# Patient Record
Sex: Female | Born: 1980 | Race: Black or African American | Hispanic: No | Smoking: Current some day smoker
Health system: Southern US, Community
[De-identification: ages and names within clinical notes are randomized; demographics above are authoritative.]

## PROBLEM LIST (undated history)

## (undated) DIAGNOSIS — F32A Depression, unspecified: Secondary | ICD-10-CM

## (undated) DIAGNOSIS — I10 Essential (primary) hypertension: Secondary | ICD-10-CM

## (undated) DIAGNOSIS — F329 Major depressive disorder, single episode, unspecified: Secondary | ICD-10-CM

## (undated) DIAGNOSIS — E282 Polycystic ovarian syndrome: Secondary | ICD-10-CM

## (undated) DIAGNOSIS — F419 Anxiety disorder, unspecified: Secondary | ICD-10-CM

## (undated) DIAGNOSIS — Z789 Other specified health status: Secondary | ICD-10-CM

## (undated) HISTORY — PX: HEMORROIDECTOMY: SUR656

---
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
## 2018-02-08 ENCOUNTER — Emergency Department (HOSPITAL_COMMUNITY)
Admission: EM | Admit: 2018-02-08 | Discharge: 2018-02-08 | Disposition: A | Discharge status: Home / Self Care | Payer: Self-pay | Attending: Emergency Medicine | Admitting: Emergency Medicine

## 2018-02-08 ENCOUNTER — Encounter (HOSPITAL_COMMUNITY): Payer: Self-pay | Admitting: Emergency Medicine

## 2018-02-08 DIAGNOSIS — I10 Essential (primary) hypertension: Secondary | ICD-10-CM | POA: Insufficient documentation

## 2018-02-08 DIAGNOSIS — F17200 Nicotine dependence, unspecified, uncomplicated: Secondary | ICD-10-CM | POA: Insufficient documentation

## 2018-02-08 DIAGNOSIS — H1033 Unspecified acute conjunctivitis, bilateral: Secondary | ICD-10-CM | POA: Insufficient documentation

## 2018-02-08 HISTORY — DX: Essential (primary) hypertension: I10

## 2018-02-08 MED ORDER — POLYMYXIN B-TRIMETHOPRIM 10000-0.1 UNIT/ML-% OP SOLN
1.0000 [drp] | OPHTHALMIC | Status: DC
  Administered 2018-02-08: 1 [drp] via OPHTHALMIC
  Filled 2018-02-08: qty 10

## 2018-02-08 MED ORDER — FLUORESCEIN SODIUM 1 MG OP STRP: 1.0000 | ORAL_STRIP | Freq: Once | OPHTHALMIC | Status: DC

## 2018-02-08 MED ORDER — TETRACAINE HCL 0.5 % OP SOLN: 2.0000 [drp] | Freq: Once | OPHTHALMIC | Status: DC

## 2018-02-08 NOTE — ED Provider Notes (Signed)
[facility] EMERGENCY DEPARTMENT Provider Note   CSN: [id number] Arrival date & time: 02/08/18  7829     History   Chief Complaint Chief Complaint  Patient presents with  . Conjunctivitis    HPI Krista Hamilton is a 37 y.o. female.  The history is provided by the patient and medical records.  Conjunctivitis      37 year old female presenting to the ED with bilateral eye redness.  States this began yesterday morning, initially in the right eye but has since spread to the left eye as well.  She denies any blurred vision.  She is not wearing glasses or contact lenses.  She denies any chemical or foreign body exposure to the eye.  No trauma.  States when she wakes in the morning her eyes are crusted shut but does have tearing during the day.  She has not tried any medications or eyedrops prior to arrival.  Past Medical History:  Diagnosis Date  . Hypertension     There are no active problems to display for this patient.   Past Surgical History:  Procedure Laterality Date  . HEMORROIDECTOMY       OB History   None      Home Medications    Prior to Admission medications   Not on File    Family History History reviewed. No pertinent family history.  Social History Social History   Tobacco Use  . Smoking status: Current Some Day Smoker  Substance Use Topics  . Alcohol use: Yes    Comment: occ  . Drug use: Not on file     Allergies   Patient has no known allergies.   Review of Systems Review of Systems  Eyes: Positive for redness.  All other systems reviewed and are negative.    Physical Exam Updated Vital Signs BP (!) 185/122 (BP Location: Right Arm)   Pulse 88   Temp 99 F (37.2 C) (Oral)   Resp 18   Ht 5\' 3"  (1.6 m)   Wt 98.4 kg   SpO2 100%   BMI 38.44 kg/m   Physical Exam  Constitutional: She is oriented to person, place, and time. She appears well-developed and well-nourished.  HENT:  Head: Normocephalic and  atraumatic.  Mouth/Throat: Oropharynx is clear and moist.  Eyes: Pupils are equal, round, and reactive to light. Conjunctivae and EOM are normal.  No significant lid edema or erythema, bilateral conjunctive are injected, right worse than left; no focal area of hemorrhage, EOMs are fully intact without nystagmus, symmetric and reactive bilaterally  Neck: Normal range of motion.  Cardiovascular: Normal rate, regular rhythm and normal heart sounds.  Pulmonary/Chest: Effort normal and breath sounds normal. No stridor. No respiratory distress.  Abdominal: Soft. Bowel sounds are normal. There is no tenderness. There is no rebound.  Musculoskeletal: Normal range of motion.  Neurological: She is alert and oriented to person, place, and time.  Skin: Skin is warm and dry.  Psychiatric: She has a normal mood and affect.  Nursing note and vitals reviewed.    ED Treatments / Results  Labs (all labs ordered are listed, but only abnormal results are displayed) Labs Reviewed - No data to display  EKG None  Radiology No results found.  Procedures Procedures (including critical care time)  Medications Ordered in ED Medications  tetracaine (PONTOCAINE) 0.5 % ophthalmic solution 2 drop (2 drops Both Eyes Not Given 02/08/18 0504)  fluorescein ophthalmic strip 1 strip (1 strip Both Eyes Not Given 02/08/18  1610)  trimethoprim-polymyxin b (POLYTRIM) ophthalmic solution 1 drop (has no administration in time range)     Initial Impression / Assessment and Plan / ED Course  I have reviewed the triage vital signs and the nursing notes.  Pertinent labs & imaging results that were available during my care of the patient were reviewed by me and considered in my medical decision making (see chart for details).  37 y.o. F here with bilateral eye redness.  She appears to have bilateral conjunctivitis, right eye is slightly worse than left.  She does not have any significant lid edema or erythema to suggest  orbital or preseptal cellulitis.  Her vision is normal.  Will start on Polytrim drops.  Discussed good hand hygiene and avoid touching her face.  Wash sheets, linens, towels after each use to prevent spread of infection.  Routine follow-up with PCP.  She will return here for any new/acute changes.  Final Clinical Impressions(s) / ED Diagnoses   Final diagnoses:  Acute bacterial conjunctivitis of both eyes    ED Discharge Orders    None       Garlon Hatchet, PA-C 02/08/18 0612    Mesner, Barbara Cower, MD 02/08/18 [phone]

## 2018-02-08 NOTE — ED Triage Notes (Signed)
Pt presents with week long eye redness initially to R eye and now bilat eyes; with perioribital swelling and tearing; pt denies itching/burning

## 2018-02-08 NOTE — Discharge Instructions (Signed)
Continue using eye drops as directed. Keep hands washed, try to avoid touching your face/eyes Wash all sheets, linens, towels, etc after each use to prevent recurrent infection. Return here for any new/acute changes.

## 2018-02-08 NOTE — ED Notes (Signed)
Patient verbalizes understanding of discharge instructions. Opportunity for questioning and answers were provided. Armband removed by staff, pt discharged from ED ambulatory by self\  

## 2018-06-19 ENCOUNTER — Other Ambulatory Visit: Payer: Self-pay

## 2018-06-19 ENCOUNTER — Emergency Department (HOSPITAL_COMMUNITY)
Admission: EM | Admit: 2018-06-19 | Discharge: 2018-06-20 | Disposition: A | Discharge status: Home / Self Care | Payer: Medicaid Other | Attending: Emergency Medicine | Admitting: Emergency Medicine

## 2018-06-19 ENCOUNTER — Encounter (HOSPITAL_COMMUNITY): Payer: Self-pay | Admitting: Emergency Medicine

## 2018-06-19 DIAGNOSIS — F17290 Nicotine dependence, other tobacco product, uncomplicated: Secondary | ICD-10-CM | POA: Diagnosis not present

## 2018-06-19 DIAGNOSIS — I10 Essential (primary) hypertension: Secondary | ICD-10-CM | POA: Diagnosis not present

## 2018-06-19 DIAGNOSIS — R1030 Lower abdominal pain, unspecified: Secondary | ICD-10-CM

## 2018-06-19 DIAGNOSIS — N739 Female pelvic inflammatory disease, unspecified: Secondary | ICD-10-CM | POA: Insufficient documentation

## 2018-06-19 DIAGNOSIS — A5901 Trichomonal vulvovaginitis: Secondary | ICD-10-CM | POA: Diagnosis not present

## 2018-06-19 DIAGNOSIS — N730 Acute parametritis and pelvic cellulitis: Secondary | ICD-10-CM

## 2018-06-19 DIAGNOSIS — A599 Trichomoniasis, unspecified: Secondary | ICD-10-CM

## 2018-06-19 HISTORY — DX: Polycystic ovarian syndrome: E28.2

## 2018-06-19 LAB — CBC
HEMATOCRIT: 39.6 % (ref 36.0–46.0)
Hemoglobin: 11.9 g/dL — ABNORMAL LOW (ref 12.0–15.0)
MCH: 21.3 pg — AB (ref 26.0–34.0)
MCHC: 30.1 g/dL (ref 30.0–36.0)
MCV: 71 fL — AB (ref 80.0–100.0)
PLATELETS: 250 10*3/uL (ref 150–400)
RBC: 5.58 MIL/uL — AB (ref 3.87–5.11)
RDW: 14.6 % (ref 11.5–15.5)
WBC: 7.6 10*3/uL (ref 4.0–10.5)
nRBC: 0 % (ref 0.0–0.2)

## 2018-06-19 LAB — COMPREHENSIVE METABOLIC PANEL
ALBUMIN: 4 g/dL (ref 3.5–5.0)
ALK PHOS: 64 U/L (ref 38–126)
ALT: 14 U/L (ref 0–44)
AST: 19 U/L (ref 15–41)
Anion gap: 9 (ref 5–15)
BUN: 12 mg/dL (ref 6–20)
CALCIUM: 9.1 mg/dL (ref 8.9–10.3)
CHLORIDE: 104 mmol/L (ref 98–111)
CO2: 24 mmol/L (ref 22–32)
CREATININE: 1.17 mg/dL — AB (ref 0.44–1.00)
GFR calc Af Amer: >60 mL/min (ref >60)
GFR calc non Af Amer: >60 mL/min (ref >60)
GLUCOSE: 84 mg/dL (ref 70–99)
Potassium: 3.9 mmol/L (ref 3.5–5.1)
SODIUM: 137 mmol/L (ref 135–145)
Total Bilirubin: 0.8 mg/dL (ref 0.3–1.2)
Total Protein: 7 g/dL (ref 6.5–8.1)

## 2018-06-19 LAB — URINALYSIS, ROUTINE W REFLEX MICROSCOPIC
Bilirubin Urine: NEGATIVE
GLUCOSE, UA: NEGATIVE mg/dL
HGB URINE DIPSTICK: NEGATIVE
Ketones, ur: 5 mg/dL — AB
Leukocytes,Ua: NEGATIVE
Nitrite: NEGATIVE
PH: 5 (ref 5.0–8.0)
PROTEIN: NEGATIVE mg/dL
Specific Gravity, Urine: 1.025 (ref 1.005–1.030)

## 2018-06-19 LAB — I-STAT BETA HCG BLOOD, ED (MC, WL, AP ONLY): I-stat hCG, quantitative: <5 m[IU]/mL (ref <5)

## 2018-06-19 LAB — LIPASE, BLOOD: LIPASE: 29 U/L (ref 11–51)

## 2018-06-19 MED ORDER — SODIUM CHLORIDE 0.9% FLUSH
3.0000 mL | Freq: Once | INTRAVENOUS | Status: AC
  Administered 2018-06-20: 3 mL via INTRAVENOUS

## 2018-06-19 NOTE — ED Triage Notes (Addendum)
C/o mid lower abd pain x 2 weeks and constipation x 1 day.  Denies nausea and vomiting. Denies urinary complaints.

## 2018-06-19 NOTE — ED Notes (Signed)
Pt remains in waiting room. Updated on wait for treatment room. 

## 2018-06-20 ENCOUNTER — Emergency Department (HOSPITAL_COMMUNITY): Payer: Medicaid Other

## 2018-06-20 LAB — WET PREP, GENITAL
SPERM: NONE SEEN
YEAST WET PREP: NONE SEEN

## 2018-06-20 MED ORDER — IOHEXOL 300 MG/ML  SOLN
100.0000 mL | Freq: Once | INTRAMUSCULAR | Status: AC | PRN
  Administered 2018-06-20: 100 mL via INTRAVENOUS

## 2018-06-20 MED ORDER — AZITHROMYCIN 250 MG PO TABS
1000.0000 mg | ORAL_TABLET | Freq: Once | ORAL | Status: AC
  Administered 2018-06-20: 1000 mg via ORAL
  Filled 2018-06-20: qty 4

## 2018-06-20 MED ORDER — CEFTRIAXONE SODIUM 250 MG IJ SOLR
250.0000 mg | Freq: Once | INTRAMUSCULAR | Status: AC
  Administered 2018-06-20: 250 mg via INTRAMUSCULAR
  Filled 2018-06-20: qty 250

## 2018-06-20 MED ORDER — LIDOCAINE HCL (PF) 1 % IJ SOLN
INTRAMUSCULAR | Status: AC
  Administered 2018-06-20: 0.9 mL
  Filled 2018-06-20: qty 5

## 2018-06-20 NOTE — ED Notes (Signed)
Patient transported to CT 

## 2018-06-20 NOTE — ED Notes (Signed)
Called by Lab. Wet prep arrived open, rendering all samples useless. MD aware.

## 2018-06-20 NOTE — ED Provider Notes (Signed)
[facility] EMERGENCY DEPARTMENT Provider Note   CSN: [id number] Arrival date & time: 06/19/18  1857    History   Chief Complaint Chief Complaint  Patient presents with  . Abdominal Pain    HPI Krista Hamilton is a 38 y.o. female.     HPI   Reports abdominal pain Constipation, pressure today 2 weeks ago, not sure if worse because it has been the same for 2 weeks or worse because of the constipation Lower abdomen, shoots the right side Pressure like pain Sometimes worse when laying on abdomen Not worse after eating, not worse with movment Rubbing the area helps some No nausea, no vomiting, no fevers No colicky pain Constant pressure 8/10 pain now No dysuria, no vaginal discharge or bleeding Sexually active  Not on blood pressure medicine   Past Medical History:  Diagnosis Date  . Hypertension   . PCOS (polycystic ovarian syndrome)     There are no active problems to display for this patient.   Past Surgical History:  Procedure Laterality Date  . HEMORROIDECTOMY       OB History   No obstetric history on file.      Home Medications    Prior to Admission medications   Not on File    Family History No family history on file.  Social History Social History   Tobacco Use  . Smoking status: Current Some Day Smoker    Types: Cigars  . Smokeless tobacco: Never Used  Substance Use Topics  . Alcohol use: Yes    Comment: occ  . Drug use: Not Currently     Allergies   Patient has no known allergies.   Review of Systems Review of Systems  Constitutional: Negative for fever.  HENT: Negative for sore throat.   Eyes: Negative for visual disturbance.  Respiratory: Negative for cough and shortness of breath.   Cardiovascular: Negative for chest pain.  Gastrointestinal: Positive for abdominal pain and constipation. Negative for diarrhea, nausea and vomiting.  Genitourinary: Negative for difficulty urinating and dysuria.    Musculoskeletal: Negative for back pain and neck pain.  Skin: Negative for rash.  Neurological: Negative for syncope and headaches.     Physical Exam Updated Vital Signs BP (!) 155/107   Pulse 72   Temp 99 F (37.2 C) (Oral)   Resp 16   LMP 05/15/2018   SpO2 98%   Physical Exam Vitals signs and nursing note reviewed.  Constitutional:      General: She is not in acute distress.    Appearance: She is well-developed. She is not diaphoretic.  HENT:     Head: Normocephalic and atraumatic.  Eyes:     Conjunctiva/sclera: Conjunctivae normal.  Neck:     Musculoskeletal: Normal range of motion.  Cardiovascular:     Rate and Rhythm: Normal rate and regular rhythm.     Heart sounds: Normal heart sounds. No murmur. No friction rub. No gallop.   Pulmonary:     Effort: Pulmonary effort is normal. No respiratory distress.     Breath sounds: Normal breath sounds. No wheezing or rales.  Abdominal:     General: There is no distension.     Palpations: Abdomen is soft.     Tenderness: There is abdominal tenderness in the right lower quadrant. There is no guarding. Positive signs include Murphy's sign.  Genitourinary:    Cervix: Discharge present. No cervical motion tenderness.     Uterus: Not tender (0-mild).  Adnexa:        Right: Tenderness present.        Left: No tenderness.    Musculoskeletal:        General: No tenderness.  Skin:    General: Skin is warm and dry.     Findings: No erythema or rash.  Neurological:     Mental Status: She is alert and oriented to person, place, and time.      ED Treatments / Results  Labs (all labs ordered are listed, but only abnormal results are displayed) Labs Reviewed  WET PREP, GENITAL - Abnormal; Notable for the following components:      Result Value   Trich, Wet Prep PRESENT (*)    Clue Cells Wet Prep HPF POC PRESENT (*)    WBC, Wet Prep HPF POC MANY (*)    All other components within normal limits  COMPREHENSIVE METABOLIC  PANEL - Abnormal; Notable for the following components:   Creatinine, Ser 1.17 (*)    All other components within normal limits  CBC - Abnormal; Notable for the following components:   RBC 5.58 (*)    Hemoglobin 11.9 (*)    MCV 71.0 (*)    MCH 21.3 (*)    All other components within normal limits  URINALYSIS, ROUTINE W REFLEX MICROSCOPIC - Abnormal; Notable for the following components:   APPearance HAZY (*)    Ketones, ur 5 (*)    All other components within normal limits  LIPASE, BLOOD  I-STAT BETA HCG BLOOD, ED ([facility], [facility], [facility] ONLY)  GC/CHLAMYDIA PROBE AMP ([place]) NOT AT [facility]    EKG None  Radiology Ct Abdomen Pelvis W Contrast  Result Date: 06/20/2018 CLINICAL DATA:  Abdominal pain for 2 days. Constipation for 1 day. History of polycystic ovarian syndrome. EXAM: CT ABDOMEN AND PELVIS WITH CONTRAST TECHNIQUE: Multidetector CT imaging of the abdomen and pelvis was performed using the standard protocol following bolus administration of intravenous contrast. CONTRAST:  OMNIPAQUE IOHEXOL 300 MG/ML  SOLN COMPARISON:  None. FINDINGS: LOWER CHEST: Lung bases are clear. Included heart size is normal. No pericardial effusion. HEPATOBILIARY: 2 subcentimeter hypodensities in the dome of the liver could reflect cysts. Liver and gallbladder are otherwise unremarkable. Mild focal fatty infiltration about the falciform ligament. PANCREAS: Normal. SPLEEN: Normal. ADRENALS/URINARY TRACT: Kidneys are orthotopic, demonstrating symmetric enhancement. No nephrolithiasis, hydronephrosis or solid renal masses. Too small to characterize hypodensities bilateral kidneys. The unopacified ureters are normal in course and caliber. Urinary bladder is partially distended and unremarkable. Normal adrenal glands. STOMACH/BOWEL: The stomach, small and large bowel are normal in course and caliber without inflammatory changes. Normal appendix. VASCULAR/LYMPHATIC: Aortoiliac vessels are normal in course and caliber.  Mild calcific atherosclerosis. No lymphadenopathy by CT size criteria. REPRODUCTIVE: Normal.  16 mm RIGHT Bartholin cyst. OTHER: Trace free fluid in the pelvis is likely physiologic. No intraperitoneal free air or focal fluid collections. MUSCULOSKELETAL: Nonacute.  Mild rectus abdominus diastasis. IMPRESSION: 1. No acute intra-abdominal/pelvic process.  Normal appendix. 2.  Aortic Atherosclerosis (ICD10-I70.0). Electronically Signed   By: Awilda Metro M.D.   On: 06/20/2018 04:47    Procedures Procedures (including critical care time)  Medications Ordered in ED Medications  sodium chloride flush (NS) 0.9 % injection 3 mL (3 mLs Intravenous Given 06/20/18 0308)  cefTRIAXone (ROCEPHIN) injection 250 mg (250 mg Intramuscular Given 06/20/18 0321)  azithromycin (ZITHROMAX) tablet 1,000 mg (1,000 mg Oral Given 06/20/18 0316)  lidocaine (PF) (XYLOCAINE) 1 % injection (0.9 mLs  Given  06/20/18 0321)  iohexol (OMNIPAQUE) 300 MG/ML solution 100 mL (100 mLs Intravenous Contrast Given 06/20/18 0357)     Initial Impression / Assessment and Plan / ED Course  I have reviewed the triage vital signs and the nursing notes.  Pertinent labs & imaging results that were available during my care of the patient were reviewed by me and considered in my medical decision making (see chart for details).       38yo female presents with lower abdominal pain. Pregnancy test negative. Hx not consistent  With nephrolithiasis or torsion.  CT shows no appendicitis. Wet prep with trichomonas. Will treat for PID. Also noted htn, will rx amlodipine.  Rx called into [facility].    Final Clinical Impressions(s) / ED Diagnoses   Final diagnoses:  PID (acute pelvic inflammatory disease)  Lower abdominal pain  Essential hypertension  Trichomonas infection    ED Discharge Orders    None       Alvira Monday, MD 06/21/18 [phone]

## 2018-06-20 NOTE — ED Notes (Signed)
Reviewed d/c instructions with pt, who verbalized understanding and had no outstanding questions. Armband and pt labels removed and placed in shred bin. Pt departed in NAD, refused use of wheelchair.   

## 2018-06-20 NOTE — ED Provider Notes (Signed)
Approached by nursing staff regarding the patient's pelvic exam.  Pelvic exam had previously been completed by Dr. Dalene Seltzer.  Nursing staff reports the cap for the wet prep tube came off in transit and needed to be recollected.  Dr. Dalene Seltzer was attending to an acutely ill patient and was not available at that time for repeat pelvic exam.  Nursing staff discussed self swab as an option for recollection, but the patient did not feel comfortable that she would collect an adequate sample.  Nursing staff reports that the patient felt comfortable with another provider repeating the pelvic exam to expedite her ER visit.  After introducing myself to the patient, I requested permission to repeat the pelvic exam.  Consent was obtained at that time.  On exam, there was a large amount of malodorous whitish-yellow discharge.  No other obvious abnormalities.  Bimanual exam was not repeated.  Dr. Dalene Seltzer to follow-up on wet prep results.   Frederik Pear A, PA-C 06/20/18 5465    Alvira Monday, MD 06/20/18 2320

## 2018-06-20 NOTE — ED Notes (Signed)
Pelvic cart to bedside 

## 2018-06-20 NOTE — ED Notes (Signed)
ED Provider at bedside. 

## 2018-06-22 LAB — GC/CHLAMYDIA PROBE AMP (~~LOC~~) NOT AT ARMC
CHLAMYDIA, DNA PROBE: NEGATIVE
NEISSERIA GONORRHEA: NEGATIVE

## 2019-05-07 ENCOUNTER — Inpatient Hospital Stay (HOSPITAL_COMMUNITY)
Admission: EM | Admit: 2019-05-07 | Discharge: 2019-05-09 | DRG: 918 | Disposition: A | Discharge status: Other Acute Inpatient Hospital | Payer: Medicaid Other | Attending: Internal Medicine | Admitting: Internal Medicine

## 2019-05-07 ENCOUNTER — Other Ambulatory Visit: Payer: Self-pay

## 2019-05-07 ENCOUNTER — Encounter (HOSPITAL_COMMUNITY): Payer: Self-pay

## 2019-05-07 DIAGNOSIS — F32A Depression, unspecified: Secondary | ICD-10-CM

## 2019-05-07 DIAGNOSIS — Y92013 Bedroom of single-family (private) house as the place of occurrence of the external cause: Secondary | ICD-10-CM

## 2019-05-07 DIAGNOSIS — F17290 Nicotine dependence, other tobacco product, uncomplicated: Secondary | ICD-10-CM | POA: Diagnosis present

## 2019-05-07 DIAGNOSIS — Z7289 Other problems related to lifestyle: Secondary | ICD-10-CM

## 2019-05-07 DIAGNOSIS — Z800 Family history of malignant neoplasm of digestive organs: Secondary | ICD-10-CM

## 2019-05-07 DIAGNOSIS — T50904A Poisoning by unspecified drugs, medicaments and biological substances, undetermined, initial encounter: Secondary | ICD-10-CM

## 2019-05-07 DIAGNOSIS — Z79899 Other long term (current) drug therapy: Secondary | ICD-10-CM

## 2019-05-07 DIAGNOSIS — T364X1A Poisoning by tetracyclines, accidental (unintentional), initial encounter: Secondary | ICD-10-CM | POA: Diagnosis present

## 2019-05-07 DIAGNOSIS — I10 Essential (primary) hypertension: Secondary | ICD-10-CM | POA: Diagnosis present

## 2019-05-07 DIAGNOSIS — F419 Anxiety disorder, unspecified: Secondary | ICD-10-CM | POA: Diagnosis present

## 2019-05-07 DIAGNOSIS — F329 Major depressive disorder, single episode, unspecified: Secondary | ICD-10-CM | POA: Diagnosis present

## 2019-05-07 DIAGNOSIS — Z20822 Contact with and (suspected) exposure to covid-19: Secondary | ICD-10-CM | POA: Diagnosis present

## 2019-05-07 DIAGNOSIS — T461X1A Poisoning by calcium-channel blockers, accidental (unintentional), initial encounter: Principal | ICD-10-CM | POA: Diagnosis present

## 2019-05-07 DIAGNOSIS — T50901A Poisoning by unspecified drugs, medicaments and biological substances, accidental (unintentional), initial encounter: Secondary | ICD-10-CM | POA: Diagnosis present

## 2019-05-07 DIAGNOSIS — G4700 Insomnia, unspecified: Secondary | ICD-10-CM | POA: Diagnosis present

## 2019-05-07 HISTORY — DX: Depression, unspecified: F32.A

## 2019-05-07 LAB — CBC WITH DIFFERENTIAL/PLATELET
Abs Immature Granulocytes: 0.01 10*3/uL (ref 0.00–0.07)
Basophils Absolute: 0.1 10*3/uL (ref 0.0–0.1)
Basophils Relative: 1 %
Eosinophils Absolute: 0.1 10*3/uL (ref 0.0–0.5)
Eosinophils Relative: 1 %
HCT: 46.4 % — ABNORMAL HIGH (ref 36.0–46.0)
Hemoglobin: 14 g/dL (ref 12.0–15.0)
Immature Granulocytes: 0 %
Lymphocytes Relative: 62 %
Lymphs Abs: 4.4 10*3/uL — ABNORMAL HIGH (ref 0.7–4.0)
MCH: 21.6 pg — ABNORMAL LOW (ref 26.0–34.0)
MCHC: 30.2 g/dL (ref 30.0–36.0)
MCV: 71.5 fL — ABNORMAL LOW (ref 80.0–100.0)
Monocytes Absolute: 0.6 10*3/uL (ref 0.1–1.0)
Monocytes Relative: 8 %
Neutro Abs: 2 10*3/uL (ref 1.7–7.7)
Neutrophils Relative %: 28 %
Platelets: 268 10*3/uL (ref 150–400)
RBC: 6.49 MIL/uL — ABNORMAL HIGH (ref 3.87–5.11)
RDW: 14.9 % (ref 11.5–15.5)
WBC: 7.2 10*3/uL (ref 4.0–10.5)
nRBC: 0 % (ref 0.0–0.2)

## 2019-05-07 LAB — ACETAMINOPHEN LEVEL: Acetaminophen (Tylenol), Serum: <10 ug/mL — ABNORMAL LOW (ref 10–30)

## 2019-05-07 LAB — COMPREHENSIVE METABOLIC PANEL
ALT: 14 U/L (ref 0–44)
AST: 22 U/L (ref 15–41)
Albumin: 4.3 g/dL (ref 3.5–5.0)
Alkaline Phosphatase: 71 U/L (ref 38–126)
Anion gap: 8 (ref 5–15)
BUN: 7 mg/dL (ref 6–20)
CO2: 24 mmol/L (ref 22–32)
Calcium: 9.2 mg/dL (ref 8.9–10.3)
Chloride: 105 mmol/L (ref 98–111)
Creatinine, Ser: 1.35 mg/dL — ABNORMAL HIGH (ref 0.44–1.00)
GFR calc Af Amer: 58 mL/min — ABNORMAL LOW (ref >60)
GFR calc non Af Amer: 50 mL/min — ABNORMAL LOW (ref >60)
Glucose, Bld: 91 mg/dL (ref 70–99)
Potassium: 4.1 mmol/L (ref 3.5–5.1)
Sodium: 137 mmol/L (ref 135–145)
Total Bilirubin: 0.7 mg/dL (ref 0.3–1.2)
Total Protein: 7.2 g/dL (ref 6.5–8.1)

## 2019-05-07 LAB — URINALYSIS, ROUTINE W REFLEX MICROSCOPIC
Bilirubin Urine: NEGATIVE
Glucose, UA: NEGATIVE mg/dL
Hgb urine dipstick: NEGATIVE
Ketones, ur: NEGATIVE mg/dL
Leukocytes,Ua: NEGATIVE
Nitrite: NEGATIVE
Protein, ur: NEGATIVE mg/dL
Specific Gravity, Urine: 1.009 (ref 1.005–1.030)
pH: 8 (ref 5.0–8.0)

## 2019-05-07 LAB — RAPID URINE DRUG SCREEN, HOSP PERFORMED
Amphetamines: NOT DETECTED
Barbiturates: NOT DETECTED
Benzodiazepines: NOT DETECTED
Cocaine: NOT DETECTED
Opiates: NOT DETECTED
Tetrahydrocannabinol: NOT DETECTED

## 2019-05-07 LAB — ETHANOL: Alcohol, Ethyl (B): <10 mg/dL (ref <10)

## 2019-05-07 LAB — CBG MONITORING, ED: Glucose-Capillary: 70 mg/dL (ref 70–99)

## 2019-05-07 LAB — I-STAT BETA HCG BLOOD, ED (MC, WL, AP ONLY): I-stat hCG, quantitative: <5 m[IU]/mL (ref <5)

## 2019-05-07 LAB — SALICYLATE LEVEL: Salicylate Lvl: <7 mg/dL — ABNORMAL LOW (ref 7.0–30.0)

## 2019-05-07 MED ORDER — SODIUM CHLORIDE 0.9 % IV BOLUS
1000.0000 mL | Freq: Once | INTRAVENOUS | Status: AC
  Administered 2019-05-07: 1000 mL via INTRAVENOUS

## 2019-05-07 MED ORDER — ACETAMINOPHEN 650 MG RE SUPP: 650.0000 mg | Freq: Four times a day (QID) | RECTAL | Status: DC | PRN

## 2019-05-07 MED ORDER — SODIUM CHLORIDE 0.9 % IV SOLN: INTRAVENOUS | Status: AC

## 2019-05-07 MED ORDER — ENOXAPARIN SODIUM 40 MG/0.4ML ~~LOC~~ SOLN
40.0000 mg | SUBCUTANEOUS | Status: DC
  Administered 2019-05-08: 40 mg via SUBCUTANEOUS
  Filled 2019-05-07 (×2): qty 0.4

## 2019-05-07 MED ORDER — SODIUM CHLORIDE 0.9 % IV SOLN: INTRAVENOUS | Status: DC

## 2019-05-07 MED ORDER — ACETAMINOPHEN 325 MG PO TABS
650.0000 mg | ORAL_TABLET | Freq: Four times a day (QID) | ORAL | Status: DC | PRN
  Administered 2019-05-08 – 2019-05-09 (×2): 650 mg via ORAL
  Filled 2019-05-07 (×2): qty 2

## 2019-05-07 NOTE — ED Provider Notes (Signed)
[facility] EMERGENCY DEPARTMENT Provider Note   CSN: [id number] Arrival date & time: 05/07/19  2138     History Chief Complaint  Patient presents with  . Drug Overdose    Krista Hamilton is a 39 y.o. female.  HPI 39 year old female with history of hypertension, presenting to the emergency department for witnessed self-injurious behavior.  Per report from EMS, patient got in a dispute with her significant other, went to another room where her child witnessed her take an unknown amount of amlodipine, as well as Excedrin and doxycycline.  Unknown amount of ingestion, approximately 2 hours prior to presentation which was approximately 8 PM.  EMS called poison control as well.  Per report this was a suicide attempt.  Patient has no documented history of suicide attempt in the past or psychiatric disease.  Pill bottles were obtained by EMS, patient was on 5 mg amlodipine daily, however the prescription was approximately 1 year ago.  Unsure amount, there were 30 pills on the initial prescription.  Unknown amount of Excedrin and doxycycline as well.  On arrival to the ED, patient was arousable to painful stimuli, hypertensive and with a normal heart rate.    Past Medical History:  Diagnosis Date  . Depression   . Hypertension   . PCOS (polycystic ovarian syndrome)     Patient Active Problem List   Diagnosis Date Noted  . Overdose 05/07/2019    Past Surgical History:  Procedure Laterality Date  . HEMORROIDECTOMY       OB History   No obstetric history on file.     Family History  Problem Relation Age of Onset  . Pancreatic cancer Father     Social History   Tobacco Use  . Smoking status: Current Some Day Smoker    Types: Cigars  . Smokeless tobacco: Never Used  Substance Use Topics  . Alcohol use: Yes    Comment: occ  . Drug use: Not Currently    Home Medications Prior to Admission medications   Medication Sig Start Date End Date Taking?  Authorizing Provider  aspirin-acetaminophen-caffeine (EXCEDRIN MIGRAINE) [phone] MG tablet Take 1 tablet by mouth every 6 (six) hours as needed for headache.   Yes [provider]    Allergies    Patient has no known allergies.  Review of Systems   Review of Systems  Unable to perform ROS: Mental status change    Physical Exam Updated Vital Signs BP (!) 147/101 (BP Location: Left Arm)   Pulse 74   Temp 100.2 F (37.9 C) (Oral)   Resp 18   Ht 5\' 5"  (1.651 m)   Wt 101.2 kg   SpO2 99%   BMI 37.11 kg/m   Physical Exam Vitals and nursing note reviewed.  Constitutional:      General: She is not in acute distress.    Appearance: Normal appearance. She is well-developed. She is not ill-appearing, toxic-appearing or diaphoretic.     Comments: Drowsy, somnolent, arousable to painful stimuli  HENT:     Head: Normocephalic and atraumatic.     Nose: Nose normal. No congestion.     Mouth/Throat:     Mouth: Mucous membranes are moist.     Pharynx: Oropharynx is clear.  Eyes:     Conjunctiva/sclera: Conjunctivae normal.  Cardiovascular:     Rate and Rhythm: Normal rate and regular rhythm.     Heart sounds: No murmur.  Pulmonary:     Effort: Pulmonary effort is normal. No respiratory  distress.     Breath sounds: Normal breath sounds.  Abdominal:     Palpations: Abdomen is soft.     Tenderness: There is no abdominal tenderness.  Musculoskeletal:     Cervical back: Neck supple.  Skin:    General: Skin is warm and dry.     Capillary Refill: Capillary refill takes less than 2 seconds.  Neurological:     General: No focal deficit present.  Psychiatric:        Attention and Perception: She is inattentive.        Mood and Affect: Mood is depressed. Mood is not elated. Affect is labile, flat and tearful. Affect is not blunt or angry.        Speech: Speech is delayed.        Behavior: Behavior is slowed and withdrawn.        Judgment: Judgment is not inappropriate.      ED Results / Procedures / Treatments   Labs (all labs ordered are listed, but only abnormal results are displayed) Labs Reviewed  COMPREHENSIVE METABOLIC PANEL - Abnormal; Notable for the following components:      Result Value   Creatinine, Ser 1.35 (*)    GFR calc non Af Amer 50 (*)    GFR calc Af Amer 58 (*)    All other components within normal limits  SALICYLATE LEVEL - Abnormal; Notable for the following components:   Salicylate Lvl <7.0 (*)    All other components within normal limits  ACETAMINOPHEN LEVEL - Abnormal; Notable for the following components:   Acetaminophen (Tylenol), Serum <10 (*)    All other components within normal limits  CBC WITH DIFFERENTIAL/PLATELET - Abnormal; Notable for the following components:   RBC 6.49 (*)    HCT 46.4 (*)    MCV 71.5 (*)    MCH 21.6 (*)    Lymphs Abs 4.4 (*)    All other components within normal limits  ACETAMINOPHEN LEVEL - Abnormal; Notable for the following components:   Acetaminophen (Tylenol), Serum <10 (*)    All other components within normal limits  COMPREHENSIVE METABOLIC PANEL - Abnormal; Notable for the following components:   Creatinine, Ser 1.17 (*)    GFR calc non Af Amer 60 (*)    All other components within normal limits  CBC - Abnormal; Notable for the following components:   RBC 5.97 (*)    MCV 71.9 (*)    MCH 21.8 (*)    All other components within normal limits  SARS CORONAVIRUS 2 (TAT 6-24 HRS)  ETHANOL  RAPID URINE DRUG SCREEN, HOSP PERFORMED  URINALYSIS, ROUTINE W REFLEX MICROSCOPIC  HIV ANTIBODY (ROUTINE TESTING W REFLEX)  CBG MONITORING, ED  I-STAT BETA HCG BLOOD, ED ([facility], [facility], [facility] ONLY)    EKG EKG Interpretation  Date/Time:  Friday May 07 2019 21:53:23 EST Ventricular Rate:  80 PR Interval:    QRS Duration: 86 QT Interval:  368 QTC Calculation: 425 R Axis:   58 Text Interpretation: Sinus rhythm Short PR interval Nonspecific T wave abnormality No previous tracing Confirmed by  Cathren Laine ([id number]) on 05/07/2019 10:33:09 PM   Radiology No results found.  Procedures Procedures (including critical care time)  Medications Ordered in ED Medications  sodium chloride 0.9 % bolus 1,000 mL (1,000 mLs Intravenous New Bag/Given 05/07/19 2222)    And  0.9 %  sodium chloride infusion ( Intravenous New Bag/Given 05/08/19 0230)  enoxaparin (LOVENOX) injection 40 mg (40 mg Subcutaneous Not Given  05/08/19 1416)  0.9 %  sodium chloride infusion ( Intravenous Not Given 05/07/19 2340)  acetaminophen (TYLENOL) tablet 650 mg (has no administration in time range)    Or  acetaminophen (TYLENOL) suppository 650 mg (has no administration in time range)  hydrALAZINE (APRESOLINE) injection 5 mg (has no administration in time range)    ED Course  I have reviewed the triage vital signs and the nursing notes.  Pertinent labs & imaging results that were available during my care of the patient were reviewed by me and considered in my medical decision making (see chart for details).    MDM Rules/Calculators/A&P                     39 year old female presenting to the ED for overdose.  Per report, patient overdosed on amlodipine, doxycycline as well as Excedrin.  Will obtain coingestion labs, end-tidal CO2 placed.  On arrival patient was somnolent but arousable to painful stimuli.  After initial evaluation, patient became more awake, stated that she only took 2 pills of amlodipine, however also has been telling nursing staff different stories as well.  Patient very tearful on exam, inattentive, and appears depressed.  Salicylate Tylenol levels normal, EKG reassuring as well, patient hypertensive as well as normal heart rate, doubt extensive calcium channel blocker overdose at this time.  However patient needs 12-hour telemetry observation given high risk.  Patient will be admitted to hospitalist service for continued observation and psychiatric evaluation as well.  Fluid bolus given.  Admitted in  stable condition.  The attending physician was present and available for all medical decision making and procedures related to this patient's care.  Final Clinical Impression(s) / ED Diagnoses Final diagnoses:  None    Rx / DC Orders ED Discharge Orders    None       Jamey Reas, MD 05/08/19 1504    Cathren Laine, MD 05/08/19 [phone]

## 2019-05-07 NOTE — ED Triage Notes (Addendum)
Pt BIB [facility] for eval of altered mental status after overdosing on amlodipine and excedrin. Per EMS, pt and her SO had argument, she went to her room and child witnessed her take unknown amount of pills from  Amlodipine, doxycycline and Excedrin bottles. Pt arrives lethargic but arousable. Sat up to vomit on arrival, then became lethargic again. GCS 9. Poison Control contacted by EMS.

## 2019-05-07 NOTE — Progress Notes (Signed)
Pt came to ED after OD at 9:40pm. TTS to verify if the pt is medically cleared due to consult being ordered shortly after pt's arrival and per chart review, poison control recommends pt be monitored for a minimum of 12 hours.   Princess Bruins, MSW, LCSW Therapeutic Triage Specialist  [phone]

## 2019-05-07 NOTE — ED Notes (Signed)
[phone] sister please call for updates

## 2019-05-07 NOTE — Progress Notes (Signed)
TTS consulted with EDP Durene Cal, Alfredia Ferguson, MD and pt's nurse Miki Kins, RN, pt will likely not be medically cleared until 10:00am on 05/08/19 due to OD and having to be monitored for 12 hours. TTS assessment to be completed once the pt is medically cleared.   Princess Bruins, MSW, LCSW Therapeutic Triage Specialist  [phone]

## 2019-05-07 NOTE — H&P (Signed)
TRH H&P    Patient Demographics:    Krista Hamilton, is a 39 y.o. female  MRN: [id number]  DOB - 1980/12/06  Admit Date - 05/07/2019  Referring MD/NP/PA:  Durene Cal  Outpatient Primary MD for the patient is Patient, No Pcp Per  Patient coming from:  home  Chief complaint- overdose   HPI:    Krista Hamilton  is a 39 y.o. female,  w hypertension, apparently had an argument with spouse and went into bedroom and took an undisclosed amount of amlodipine as well as doxycycline and excedrin.  Pt appeared lethargic per EMS and n/v, and brought to ER.   Pt states that she only took 2 amlodipine  Pt states that she took the amlodipine due to headache.   Per ER, poison control recommends observation for 12 hours.   In ED,  T 98.7, P 68 R 15, Bp 176/107  Pox 100% on 2 L [place] 100kg  Na 137, K 4.1, Bun 7, Creatinine 1.35 Ast 22, Alt 14 Tylenol <10 Salicylate <7 Wbc 7.2, hgb 14.0, Plt 268  UDS negative  Urinalysis negative   ED consulted psychiatry and they will be by in AM per ED.       Review of systems:    In addition to the HPI above,  No Fever-chills,  No changes with Vision or hearing, No problems swallowing food or Liquids, No Chest pain, Cough or Shortness of Breath, No Abdominal pain, No Nausea or Vomiting, bowel movements are regular, No Blood in stool or Urine, No dysuria, No new skin rashes or bruises, No new joints pains-aches,  No new weakness, tingling, numbness in any extremity, No recent weight gain or loss, No polyuria, polydypsia or polyphagia, No significant Mental Stressors.  All other systems reviewed and are negative.    Past History of the following :    Past Medical History:  Diagnosis Date  . Depression   . Hypertension   . PCOS (polycystic ovarian syndrome)       Past Surgical History:  Procedure Laterality Date  . HEMORROIDECTOMY        Social History:       Social History   Tobacco Use  . Smoking status: Current Some Day Smoker    Types: Cigars  . Smokeless tobacco: Never Used  Substance Use Topics  . Alcohol use: Yes    Comment: occ       Family History :     Family History  Problem Relation Age of Onset  . Pancreatic cancer Father        Home Medications:   Prior to Admission medications   Not on File     Allergies:    No Known Allergies   Physical Exam:   Vitals  Blood pressure (!) 182/123, pulse 85, temperature 98.7 F (37.1 C), temperature source Oral, resp. rate 20, height 5\' 5"  (1.651 m), weight 100 kg, SpO2 100 %.  1.  General: axoxox3  2. Psychiatric: Depressed   3. Neurologic: Nonfocal,  cn2-12 intact, reflexes 2+ symmetric, diffuse  with no clonus, motor 5/5 in all 4 ext   4. HEENMT:  Anicteric, pupils 1.72mm symmetric, direct, consensual, near intact Neck: no jvd  5. Respiratory : CTAB  6. Cardiovascular : rrr s1, s2, no m/g/r  7. Gastrointestinal:  Abd: soft, obese, nt, nd, +bs  8. Skin:  Ext: no c/c/e, no rash  9.Musculoskeletal:  Good ROM    Data Review:    CBC Recent Labs  Lab 05/07/19 2151  WBC 7.2  HGB 14.0  HCT 46.4*  PLT 268  MCV 71.5*  MCH 21.6*  MCHC 30.2  RDW 14.9  LYMPHSABS 4.4*  MONOABS 0.6  EOSABS 0.1  BASOSABS 0.1   ------------------------------------------------------------------------------------------------------------------  Results for orders placed or performed during the hospital encounter of 05/07/19 (from the past 48 hour(s))  Comprehensive metabolic panel     Status: Abnormal   Collection Time: 05/07/19  9:51 PM  Result Value Ref Range   Sodium 137 135 - 145 mmol/L   Potassium 4.1 3.5 - 5.1 mmol/L   Chloride 105 98 - 111 mmol/L   CO2 24 22 - 32 mmol/L   Glucose, Bld 91 70 - 99 mg/dL   BUN 7 6 - 20 mg/dL   Creatinine, Ser 2.99 (H) 0.44 - 1.00 mg/dL   Calcium 9.2 8.9 - 24.2 mg/dL   Total Protein 7.2 6.5 - 8.1 g/dL   Albumin  4.3 3.5 - 5.0 g/dL   AST 22 15 - 41 U/L   ALT 14 0 - 44 U/L   Alkaline Phosphatase 71 38 - 126 U/L   Total Bilirubin 0.7 0.3 - 1.2 mg/dL   GFR calc non Af Amer 50 (L) >60 mL/min   GFR calc Af Amer 58 (L) >60 mL/min   Anion gap 8 5 - 15    Comment: Performed at [facility], [address] [place], [place] [address]  Salicylate level     Status: Abnormal   Collection Time: 05/07/19  9:51 PM  Result Value Ref Range   Salicylate Lvl <7.0 (L) 7.0 - 30.0 mg/dL    Comment: Performed at [facility], [address] [place], [place] [address]  Acetaminophen level     Status: Abnormal   Collection Time: 05/07/19  9:51 PM  Result Value Ref Range   Acetaminophen (Tylenol), Serum <10 (L) 10 - 30 ug/mL    Comment: (NOTE) Therapeutic concentrations vary significantly. A range of 10-30 ug/mL  may be an effective concentration for many patients. However, some  are best treated at concentrations outside of this range. Acetaminophen concentrations >150 ug/mL at 4 hours after ingestion  and >50 ug/mL at 12 hours after ingestion are often associated with  toxic reactions. Performed at [facility], [address] [place], [place] [address]   Ethanol     Status: None   Collection Time: 05/07/19  9:51 PM  Result Value Ref Range   Alcohol, Ethyl (B) <10 <10 mg/dL    Comment: (NOTE) Lowest detectable limit for serum alcohol is 10 mg/dL. For medical purposes only. Performed at [facility], [address], [place], [place] [address]   CBC WITH DIFFERENTIAL     Status: Abnormal   Collection Time: 05/07/19  9:51 PM  Result Value Ref Range   WBC 7.2 4.0 - 10.5 K/uL   RBC 6.49 (H) 3.87 - 5.11 MIL/uL   Hemoglobin 14.0 12.0 - 15.0 g/dL   HCT 17.4 (H) 08.1 - 44.8 %   MCV  71.5 (L) 80.0 - 100.0 fL   MCH 21.6 (L) 26.0 - 34.0 pg   MCHC 30.2 30.0 - 36.0 g/dL   RDW 62.3 76.2 - 83.1 %   Platelets 268 150 - 400 K/uL   nRBC 0.0 0.0 - 0.2 %   Neutrophils Relative % 28 %   Neutro  Abs 2.0 1.7 - 7.7 K/uL   Lymphocytes Relative 62 %   Lymphs Abs 4.4 (H) 0.7 - 4.0 K/uL   Monocytes Relative 8 %   Monocytes Absolute 0.6 0.1 - 1.0 K/uL   Eosinophils Relative 1 %   Eosinophils Absolute 0.1 0.0 - 0.5 K/uL   Basophils Relative 1 %   Basophils Absolute 0.1 0.0 - 0.1 K/uL   Immature Granulocytes 0 %   Abs Immature Granulocytes 0.01 0.00 - 0.07 K/uL    Comment: Performed at [facility], [address] [place], [place] [address]  I-Stat beta hCG blood, ED     Status: None   Collection Time: 05/07/19 10:12 PM  Result Value Ref Range   I-stat hCG, quantitative <5.0 <5 mIU/mL   Comment 3            Comment:   GEST. AGE      CONC.  (mIU/mL)   <=1 WEEK        5 - 50     2 WEEKS       50 - 500     3 WEEKS       100 - 10,000     4 WEEKS     1,000 - 30,000        FEMALE AND NON-PREGNANT FEMALE:     LESS THAN 5 mIU/mL   CBG monitoring, ED     Status: None   Collection Time: 05/07/19 10:21 PM  Result Value Ref Range   Glucose-Capillary 70 70 - 99 mg/dL  Urine rapid drug screen (hosp performed)     Status: None   Collection Time: 05/07/19 10:32 PM  Result Value Ref Range   Opiates NONE DETECTED NONE DETECTED   Cocaine NONE DETECTED NONE DETECTED   Benzodiazepines NONE DETECTED NONE DETECTED   Amphetamines NONE DETECTED NONE DETECTED   Tetrahydrocannabinol NONE DETECTED NONE DETECTED   Barbiturates NONE DETECTED NONE DETECTED    Comment: (NOTE) DRUG SCREEN FOR MEDICAL PURPOSES ONLY.  IF CONFIRMATION IS NEEDED FOR ANY PURPOSE, NOTIFY LAB WITHIN 5 DAYS. LOWEST DETECTABLE LIMITS FOR URINE DRUG SCREEN Drug Class                     Cutoff (ng/mL) Amphetamine and metabolites    1000 Barbiturate and metabolites    200 Benzodiazepine                 200 Tricyclics and metabolites     300 Opiates and metabolites        300 Cocaine and metabolites        300 THC                            50 Performed at [facility], [address], [place],  [place] [address]   Urinalysis, Routine w reflex microscopic     Status: None   Collection Time: 05/07/19 10:32 PM  Result Value Ref Range   Color, Urine YELLOW YELLOW   APPearance CLEAR CLEAR   Specific Gravity, Urine 1.009 1.005 - 1.030   pH  8.0 5.0 - 8.0   Glucose, UA NEGATIVE NEGATIVE mg/dL   Hgb urine dipstick NEGATIVE NEGATIVE   Bilirubin Urine NEGATIVE NEGATIVE   Ketones, ur NEGATIVE NEGATIVE mg/dL   Protein, ur NEGATIVE NEGATIVE mg/dL   Nitrite NEGATIVE NEGATIVE   Leukocytes,Ua NEGATIVE NEGATIVE    Comment: Performed at [facility] [address], [place], [place] [address]    Chemistries  Recent Labs  Lab 05/07/19 2151  NA 137  K 4.1  CL 105  CO2 24  GLUCOSE 91  BUN 7  CREATININE 1.35*  CALCIUM 9.2  AST 22  ALT 14  ALKPHOS 71  BILITOT 0.7   ------------------------------------------------------------------------------------------------------------------  ------------------------------------------------------------------------------------------------------------------ GFR: Estimated Creatinine Clearance: 67.5 mL/min (A) (by C-G formula based on SCr of 1.35 mg/dL (H)). Liver Function Tests: Recent Labs  Lab 05/07/19 2151  AST 22  ALT 14  ALKPHOS 71  BILITOT 0.7  PROT 7.2  ALBUMIN 4.3   No results for input(s): LIPASE, AMYLASE in the last 168 hours. No results for input(s): AMMONIA in the last 168 hours. Coagulation Profile: No results for input(s): INR, PROTIME in the last 168 hours. Cardiac Enzymes: No results for input(s): CKTOTAL, CKMB, CKMBINDEX, TROPONINI in the last 168 hours. BNP (last 3 results) No results for input(s): PROBNP in the last 8760 hours. HbA1C: No results for input(s): HGBA1C in the last 72 hours. CBG: Recent Labs  Lab 05/07/19 2221  GLUCAP 70   Lipid Profile: No results for input(s): CHOL, HDL, LDLCALC, TRIG, CHOLHDL, LDLDIRECT in the last 72 hours. Thyroid Function Tests: No results for input(s): TSH, T4TOTAL,  FREET4, T3FREE, THYROIDAB in the last 72 hours. Anemia Panel: No results for input(s): VITAMINB12, FOLATE, FERRITIN, TIBC, IRON, RETICCTPCT in the last 72 hours.  --------------------------------------------------------------------------------------------------------------- Urine analysis:    Component Value Date/Time   COLORURINE YELLOW 05/07/2019 2232   APPEARANCEUR CLEAR 05/07/2019 2232   LABSPEC 1.009 05/07/2019 2232   PHURINE 8.0 05/07/2019 2232   GLUCOSEU NEGATIVE 05/07/2019 2232   HGBUR NEGATIVE 05/07/2019 2232   BILIRUBINUR NEGATIVE 05/07/2019 2232   KETONESUR NEGATIVE 05/07/2019 2232   PROTEINUR NEGATIVE 05/07/2019 2232   NITRITE NEGATIVE 05/07/2019 2232   LEUKOCYTESUR NEGATIVE 05/07/2019 2232      Imaging Results:    No results found. Ekg, nsr at 29, nl axis, nl int, no st-t changes c/w ischemia   Assessment & Plan:    Principal Problem:   Overdose  Overdose 1:1 for safety Will monitor for the next 12 hours, bp actually is a little high Check tylenol level  Psychiatry consulted by ED, ED states will be by in am  Hypertension Hydralazine 5mg  iv q6h prn sbp >160    DVT Prophylaxis-   Lovenox - SCDs   AM Labs Ordered, also please review Full Orders  Family Communication: Admission, patients condition and plan of care including tests being ordered have been discussed with the patient  who indicate understanding and agree with the plan and Code Status.  Code Status:  FULL CODE per patient, notified mother that patient admitted to [facility]   Admission status: Observation: Based on patients clinical presentation and evaluation of above clinical data, I have made determination that patient meets observation criteria at this time.   Time spent in minutes : 55 minutes   Jani Gravel M.D on 05/08/2019 at 1:10 AM

## 2019-05-07 NOTE — ED Notes (Signed)
Pt now alert and talking, states she just took the excedrin because she had a headache. Denies taking amlodipine or doxycycline. Shrugs her shoulders when asked if this was intentional attempt to harm self. Does not deny SI or HI to this Clinical research associate, unable to contract for safety

## 2019-05-07 NOTE — ED Notes (Signed)
Spoke w/ Danielle at [employer] about case.  Amlodipine-12 hr obs on tele Excedrin- 4 hour tylenol level @ 0000 Level less than 150 no treatment Greater than 150 treat w/ acetylcysteine Salicylate level now and repeat w/ electrolytes @ 0000-0100  Look for: AKI (doxy), hypotension, brady, liver injury, metabolic acidosis, dysrhythmias.  Give fluids, norepi prn and calc gluconate Huston Foley- give atropine prn Replete lytes as needed, esp. K+ ASA level greater than 40- bicarb drip prn

## 2019-05-07 NOTE — ED Notes (Signed)
[phone] Husband Jeannett Senior wants an update when available

## 2019-05-07 NOTE — ED Notes (Signed)
Called Teacher, adult education available

## 2019-05-07 NOTE — ED Notes (Signed)
Pt belongings consist of pair of leggings, sweater, tank top, socks and cell phone, belongings removed from room, pt tearful, and withdrawn, when pt questioned if she was still endorsing SI pt refused to answer and continued to cry.

## 2019-05-08 ENCOUNTER — Encounter (HOSPITAL_COMMUNITY): Payer: Self-pay | Admitting: Internal Medicine

## 2019-05-08 DIAGNOSIS — F17290 Nicotine dependence, other tobacco product, uncomplicated: Secondary | ICD-10-CM | POA: Diagnosis present

## 2019-05-08 DIAGNOSIS — Z20822 Contact with and (suspected) exposure to covid-19: Secondary | ICD-10-CM | POA: Diagnosis present

## 2019-05-08 DIAGNOSIS — T364X1A Poisoning by tetracyclines, accidental (unintentional), initial encounter: Secondary | ICD-10-CM | POA: Diagnosis present

## 2019-05-08 DIAGNOSIS — I10 Essential (primary) hypertension: Secondary | ICD-10-CM

## 2019-05-08 DIAGNOSIS — T50901A Poisoning by unspecified drugs, medicaments and biological substances, accidental (unintentional), initial encounter: Secondary | ICD-10-CM | POA: Diagnosis not present

## 2019-05-08 DIAGNOSIS — G4709 Other insomnia: Secondary | ICD-10-CM

## 2019-05-08 DIAGNOSIS — G4700 Insomnia, unspecified: Secondary | ICD-10-CM | POA: Diagnosis present

## 2019-05-08 DIAGNOSIS — Y92013 Bedroom of single-family (private) house as the place of occurrence of the external cause: Secondary | ICD-10-CM | POA: Diagnosis not present

## 2019-05-08 DIAGNOSIS — F418 Other specified anxiety disorders: Secondary | ICD-10-CM | POA: Diagnosis not present

## 2019-05-08 DIAGNOSIS — F419 Anxiety disorder, unspecified: Secondary | ICD-10-CM | POA: Diagnosis present

## 2019-05-08 DIAGNOSIS — F332 Major depressive disorder, recurrent severe without psychotic features: Secondary | ICD-10-CM | POA: Diagnosis not present

## 2019-05-08 DIAGNOSIS — Z7289 Other problems related to lifestyle: Secondary | ICD-10-CM | POA: Diagnosis not present

## 2019-05-08 DIAGNOSIS — F329 Major depressive disorder, single episode, unspecified: Secondary | ICD-10-CM | POA: Diagnosis present

## 2019-05-08 DIAGNOSIS — Z79899 Other long term (current) drug therapy: Secondary | ICD-10-CM | POA: Diagnosis not present

## 2019-05-08 DIAGNOSIS — Z800 Family history of malignant neoplasm of digestive organs: Secondary | ICD-10-CM | POA: Diagnosis not present

## 2019-05-08 DIAGNOSIS — T50904A Poisoning by unspecified drugs, medicaments and biological substances, undetermined, initial encounter: Secondary | ICD-10-CM | POA: Diagnosis not present

## 2019-05-08 DIAGNOSIS — T461X1A Poisoning by calcium-channel blockers, accidental (unintentional), initial encounter: Secondary | ICD-10-CM | POA: Diagnosis not present

## 2019-05-08 LAB — CBC
HCT: 42.9 % (ref 36.0–46.0)
Hemoglobin: 13 g/dL (ref 12.0–15.0)
MCH: 21.8 pg — ABNORMAL LOW (ref 26.0–34.0)
MCHC: 30.3 g/dL (ref 30.0–36.0)
MCV: 71.9 fL — ABNORMAL LOW (ref 80.0–100.0)
Platelets: 260 10*3/uL (ref 150–400)
RBC: 5.97 MIL/uL — ABNORMAL HIGH (ref 3.87–5.11)
RDW: 14.8 % (ref 11.5–15.5)
WBC: 6.2 10*3/uL (ref 4.0–10.5)
nRBC: 0 % (ref 0.0–0.2)

## 2019-05-08 LAB — COMPREHENSIVE METABOLIC PANEL
ALT: 13 U/L (ref 0–44)
AST: 17 U/L (ref 15–41)
Albumin: 3.7 g/dL (ref 3.5–5.0)
Alkaline Phosphatase: 63 U/L (ref 38–126)
Anion gap: 8 (ref 5–15)
BUN: 7 mg/dL (ref 6–20)
CO2: 24 mmol/L (ref 22–32)
Calcium: 9.2 mg/dL (ref 8.9–10.3)
Chloride: 106 mmol/L (ref 98–111)
Creatinine, Ser: 1.17 mg/dL — ABNORMAL HIGH (ref 0.44–1.00)
GFR calc Af Amer: >60 mL/min (ref >60)
GFR calc non Af Amer: 60 mL/min — ABNORMAL LOW (ref >60)
Glucose, Bld: 82 mg/dL (ref 70–99)
Potassium: 4.4 mmol/L (ref 3.5–5.1)
Sodium: 138 mmol/L (ref 135–145)
Total Bilirubin: 1.1 mg/dL (ref 0.3–1.2)
Total Protein: 6.5 g/dL (ref 6.5–8.1)

## 2019-05-08 LAB — HIV ANTIBODY (ROUTINE TESTING W REFLEX): HIV Screen 4th Generation wRfx: NONREACTIVE

## 2019-05-08 LAB — ACETAMINOPHEN LEVEL: Acetaminophen (Tylenol), Serum: <10 ug/mL — ABNORMAL LOW (ref 10–30)

## 2019-05-08 LAB — SARS CORONAVIRUS 2 (TAT 6-24 HRS): SARS Coronavirus 2: NEGATIVE

## 2019-05-08 MED ORDER — SERTRALINE HCL 50 MG PO TABS
25.0000 mg | ORAL_TABLET | Freq: Every day | ORAL | Status: DC
  Administered 2019-05-09: 25 mg via ORAL
  Filled 2019-05-08: qty 1

## 2019-05-08 MED ORDER — HYDRALAZINE HCL 20 MG/ML IJ SOLN: 10.0000 mg | Freq: Four times a day (QID) | INTRAMUSCULAR | Status: DC | PRN

## 2019-05-08 MED ORDER — AMLODIPINE BESYLATE 5 MG PO TABS
5.0000 mg | ORAL_TABLET | Freq: Every day | ORAL | Status: DC
  Administered 2019-05-09: 5 mg via ORAL
  Filled 2019-05-08: qty 1

## 2019-05-08 MED ORDER — AMLODIPINE BESYLATE 5 MG PO TABS: 5.0000 mg | ORAL_TABLET | Freq: Every day | ORAL | Status: DC

## 2019-05-08 MED ORDER — TRAZODONE HCL 50 MG PO TABS
50.0000 mg | ORAL_TABLET | Freq: Every day | ORAL | Status: DC
  Administered 2019-05-08: 50 mg via ORAL
  Filled 2019-05-08: qty 1

## 2019-05-08 MED ORDER — HYDRALAZINE HCL 20 MG/ML IJ SOLN: 5.0000 mg | Freq: Four times a day (QID) | INTRAMUSCULAR | Status: DC | PRN

## 2019-05-08 NOTE — Consult Note (Addendum)
Telepsych Consultation   Reason for Consult:  " Depression, overdose" Referring Physician:  Dr Kurtis Bushman [facility] of [facility]: [facility] Location of Provider: [facility]  Patient Identification: Krista Hamilton MRN:  [id number] Principal Diagnosis: Overdose Diagnosis:  Principal Problem:   Overdose   Total Time spent with patient: 30 minutes  Subjective:   Krista Hamilton is a 39 y.o. female patient.  Patient assessed by nurse practitioner.  Patient alert and oriented, answers questions appropriately.  Patient states "I took 2 of my old blood pressure pills and a few Benadryl, I was just tired."  Patient reports "I have been depressed for a while, my mind is always wondering, thinking about missing my dad and my brother they passed away a year apart in 17-Aug-2011."  Patient tearful at times during assessment. Patient denies suicidal and homicidal ideations at this time.  Patient denies auditory and visual hallucinations.  Patient reports 1 prior suicide attempt when she was 39 years old.  Patient reports she does not currently see an outpatient psychiatrist.  Patient reports history of using Zoloft and Xanax for having her anxiety.  Patient reports she discontinued medication because "I do not like taking pills but I feel like it helped a little."  Patient reports she would like to begin medication for her anxiety.  Patient reports she is interested in inpatient psychiatric treatment. Patient reports she lives at home with her husband and 2 sons ages 53 and 39 years old.  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  HPI: Intentional overdose  Past Psychiatric History: Anxiety  Risk to Self:  Yes Risk to Others:  Denies Prior Inpatient Therapy:  Denies Prior Outpatient Therapy:  Yes  Past Medical History:  Past Medical History:  Diagnosis Date  . Depression   . Hypertension    . PCOS (polycystic ovarian syndrome)     Past Surgical History:  Procedure Laterality Date  . HEMORROIDECTOMY     Family History:  Family History  Problem Relation Age of Onset  . Pancreatic cancer Father    Family Psychiatric  History: Denies Social History:  Social History   Substance and Sexual Activity  Alcohol Use Yes   Comment: occ     Social History   Substance and Sexual Activity  Drug Use Not Currently    Social History   Socioeconomic History  . Marital status: Legally Separated    Spouse name: Not on file  . Number of children: Not on file  . Years of education: Not on file  . Highest education level: Not on file  Occupational History  . Not on file  Tobacco Use  . Smoking status: Current Some Day Smoker    Types: Cigars  . Smokeless tobacco: Never Used  Substance and Sexual Activity  . Alcohol use: Yes    Comment: occ  . Drug use: Not Currently  . Sexual activity: Not on file  Other Topics Concern  . Not on file  Social History Narrative  . Not on file   Social Determinants of Health   Financial Resource Strain:   . Difficulty of Paying Living Expenses: Not on file  Food Insecurity:   . Worried About Charity fundraiser in the Last Year: Not on file  . Ran Out of Food in the Last Year: Not on file  Transportation Needs:   . Lack of Transportation (Medical): Not  on file  . Lack of Transportation (Non-Medical): Not on file  Physical Activity:   . Days of Exercise per Week: Not on file  . Minutes of Exercise per Session: Not on file  Stress:   . Feeling of Stress : Not on file  Social Connections:   . Frequency of Communication with Friends and Family: Not on file  . Frequency of Social Gatherings with Friends and Family: Not on file  . Attends Religious Services: Not on file  . Active Member of Clubs or Organizations: Not on file  . Attends Banker Meetings: Not on file  . Marital Status: Not on file   Additional Social  History:    Allergies:  No Known Allergies  Labs:  Results for orders placed or performed during the hospital encounter of 05/07/19 (from the past 48 hour(s))  Comprehensive metabolic panel     Status: Abnormal   Collection Time: 05/07/19  9:51 PM  Result Value Ref Range   Sodium 137 135 - 145 mmol/L   Potassium 4.1 3.5 - 5.1 mmol/L   Chloride 105 98 - 111 mmol/L   CO2 24 22 - 32 mmol/L   Glucose, Bld 91 70 - 99 mg/dL   BUN 7 6 - 20 mg/dL   Creatinine, Ser 6.94 (H) 0.44 - 1.00 mg/dL   Calcium 9.2 8.9 - 85.4 mg/dL   Total Protein 7.2 6.5 - 8.1 g/dL   Albumin 4.3 3.5 - 5.0 g/dL   AST 22 15 - 41 U/L   ALT 14 0 - 44 U/L   Alkaline Phosphatase 71 38 - 126 U/L   Total Bilirubin 0.7 0.3 - 1.2 mg/dL   GFR calc non Af Amer 50 (L) >60 mL/min   GFR calc Af Amer 58 (L) >60 mL/min   Anion gap 8 5 - 15    Comment: Performed at [facility], [address] [place], [place] [address]  Salicylate level     Status: Abnormal   Collection Time: 05/07/19  9:51 PM  Result Value Ref Range   Salicylate Lvl <7.0 (L) 7.0 - 30.0 mg/dL    Comment: Performed at [facility], [address] [place], [place] [address]  Acetaminophen level     Status: Abnormal   Collection Time: 05/07/19  9:51 PM  Result Value Ref Range   Acetaminophen (Tylenol), Serum <10 (L) 10 - 30 ug/mL    Comment: (NOTE) Therapeutic concentrations vary significantly. A range of 10-30 ug/mL  may be an effective concentration for many patients. However, some  are best treated at concentrations outside of this range. Acetaminophen concentrations >150 ug/mL at 4 hours after ingestion  and >50 ug/mL at 12 hours after ingestion are often associated with  toxic reactions. Performed at [facility], [address] [place], [place] [address]   Ethanol     Status: None   Collection Time: 05/07/19  9:51 PM  Result Value Ref Range   Alcohol, Ethyl (B) <10 <10 mg/dL    Comment: (NOTE) Lowest detectable limit for  serum alcohol is 10 mg/dL. For medical purposes only. Performed at [facility], [address], [place], [place] [address]   CBC WITH DIFFERENTIAL     Status: Abnormal   Collection Time: 05/07/19  9:51 PM  Result Value Ref Range   WBC 7.2 4.0 - 10.5 K/uL   RBC 6.49 (H) 3.87 - 5.11 MIL/uL   Hemoglobin 14.0 12.0 - 15.0 g/dL  HCT 46.4 (H) 36.0 - 46.0 %   MCV 71.5 (L) 80.0 - 100.0 fL   MCH 21.6 (L) 26.0 - 34.0 pg   MCHC 30.2 30.0 - 36.0 g/dL   RDW 16.114.9 09.611.5 - 04.515.5 %   Platelets 268 150 - 400 K/uL   nRBC 0.0 0.0 - 0.2 %   Neutrophils Relative % 28 %   Neutro Abs 2.0 1.7 - 7.7 K/uL   Lymphocytes Relative 62 %   Lymphs Abs 4.4 (H) 0.7 - 4.0 K/uL   Monocytes Relative 8 %   Monocytes Absolute 0.6 0.1 - 1.0 K/uL   Eosinophils Relative 1 %   Eosinophils Absolute 0.1 0.0 - 0.5 K/uL   Basophils Relative 1 %   Basophils Absolute 0.1 0.0 - 0.1 K/uL   Immature Granulocytes 0 %   Abs Immature Granulocytes 0.01 0.00 - 0.07 K/uL    Comment: Performed at [facility], [address] [place], [place] [address]  I-Stat beta hCG blood, ED     Status: None   Collection Time: 05/07/19 10:12 PM  Result Value Ref Range   I-stat hCG, quantitative <5.0 <5 mIU/mL   Comment 3            Comment:   GEST. AGE      CONC.  (mIU/mL)   <=1 WEEK        5 - 50     2 WEEKS       50 - 500     3 WEEKS       100 - 10,000     4 WEEKS     1,000 - 30,000        FEMALE AND NON-PREGNANT FEMALE:     LESS THAN 5 mIU/mL   CBG monitoring, ED     Status: None   Collection Time: 05/07/19 10:21 PM  Result Value Ref Range   Glucose-Capillary 70 70 - 99 mg/dL  SARS CORONAVIRUS 2 (TAT 6-24 HRS) Nasopharyngeal Nasopharyngeal Swab     Status: None   Collection Time: 05/07/19 10:22 PM   Specimen: Nasopharyngeal Swab  Result Value Ref Range   SARS Coronavirus 2 NEGATIVE NEGATIVE    Comment: (NOTE) SARS-CoV-2 target nucleic acids are NOT DETECTED. The SARS-CoV-2 RNA is generally detectable in upper and  lower respiratory specimens during the acute phase of infection. Negative results do not preclude SARS-CoV-2 infection, do not rule out co-infections with other pathogens, and should not be used as the sole basis for treatment or other patient management decisions. Negative results must be combined with clinical observations, patient history, and epidemiological information. The expected result is Negative. Fact Sheet for Patients: HairSlick.nohttps://www.fda.gov/media/138098/download Fact Sheet for Healthcare Providers: quierodirigir.comhttps://www.fda.gov/media/138095/download This test is not yet approved or cleared by the [place] FDA and  has been authorized for detection and/or diagnosis of SARS-CoV-2 by FDA under an Emergency Use Authorization (EUA). This EUA will remain  in effect (meaning this test can be used) for the duration of the COVID-19 declaration under Section 56 4(b)(1) of the Act, 21 U.S.C. section 360bbb-3(b)(1), unless the authorization is terminated or revoked sooner. Performed at [facility], [address] [place], [place] [address]   Urine rapid drug screen (hosp performed)     Status: None   Collection Time: 05/07/19 10:32 PM  Result Value Ref Range   Opiates NONE DETECTED NONE DETECTED   Cocaine NONE DETECTED NONE DETECTED   Benzodiazepines NONE DETECTED NONE DETECTED   Amphetamines NONE  DETECTED NONE DETECTED   Tetrahydrocannabinol NONE DETECTED NONE DETECTED   Barbiturates NONE DETECTED NONE DETECTED    Comment: (NOTE) DRUG SCREEN FOR MEDICAL PURPOSES ONLY.  IF CONFIRMATION IS NEEDED FOR ANY PURPOSE, NOTIFY LAB WITHIN 5 DAYS. LOWEST DETECTABLE LIMITS FOR URINE DRUG SCREEN Drug Class                     Cutoff (ng/mL) Amphetamine and metabolites    1000 Barbiturate and metabolites    200 Benzodiazepine                 200 Tricyclics and metabolites     300 Opiates and metabolites        300 Cocaine and metabolites        300 THC                             50 Performed at [facility], [address], [place], [place] [address]   Urinalysis, Routine w reflex microscopic     Status: None   Collection Time: 05/07/19 10:32 PM  Result Value Ref Range   Color, Urine YELLOW YELLOW   APPearance CLEAR CLEAR   Specific Gravity, Urine 1.009 1.005 - 1.030   pH 8.0 5.0 - 8.0   Glucose, UA NEGATIVE NEGATIVE mg/dL   Hgb urine dipstick NEGATIVE NEGATIVE   Bilirubin Urine NEGATIVE NEGATIVE   Ketones, ur NEGATIVE NEGATIVE mg/dL   Protein, ur NEGATIVE NEGATIVE mg/dL   Nitrite NEGATIVE NEGATIVE   Leukocytes,Ua NEGATIVE NEGATIVE    Comment: Performed at [facility], [address] [place], [place] [address]  Acetaminophen level     Status: Abnormal   Collection Time: 05/08/19 12:00 AM  Result Value Ref Range   Acetaminophen (Tylenol), Serum <10 (L) 10 - 30 ug/mL    Comment: (NOTE) Therapeutic concentrations vary significantly. A range of 10-30 ug/mL  may be an effective concentration for many patients. However, some  are best treated at concentrations outside of this range. Acetaminophen concentrations >150 ug/mL at 4 hours after ingestion  and >50 ug/mL at 12 hours after ingestion are often associated with  toxic reactions. Performed at [facility], [address] [place], [place] [address]   HIV Antibody (routine testing w rflx)     Status: None   Collection Time: 05/08/19  3:07 AM  Result Value Ref Range   HIV Screen 4th Generation wRfx NON REACTIVE NON REACTIVE    Comment: Performed at [facility], [address] [place], [place] [address]  Comprehensive metabolic panel     Status: Abnormal   Collection Time: 05/08/19  3:07 AM  Result Value Ref Range   Sodium 138 135 - 145 mmol/L   Potassium 4.4 3.5 - 5.1 mmol/L   Chloride 106 98 - 111 mmol/L   CO2 24 22 - 32 mmol/L   Glucose, Bld 82 70 - 99 mg/dL   BUN 7 6 - 20 mg/dL   Creatinine, Ser 1.66 (H) 0.44 - 1.00 mg/dL   Calcium 9.2 8.9 - 06.3 mg/dL   Total  Protein 6.5 6.5 - 8.1 g/dL   Albumin 3.7 3.5 - 5.0 g/dL   AST 17 15 - 41 U/L   ALT 13 0 - 44 U/L   Alkaline Phosphatase 63 38 - 126 U/L   Total Bilirubin 1.1 0.3 - 1.2 mg/dL   GFR calc non Af Amer 60 (  L) >60 mL/min   GFR calc Af Amer >60 >60 mL/min   Anion gap 8 5 - 15    Comment: Performed at [facility], [address], [place], [place] [address]  CBC     Status: Abnormal   Collection Time: 05/08/19  3:07 AM  Result Value Ref Range   WBC 6.2 4.0 - 10.5 K/uL   RBC 5.97 (H) 3.87 - 5.11 MIL/uL   Hemoglobin 13.0 12.0 - 15.0 g/dL   HCT 52.7 78.2 - 42.3 %   MCV 71.9 (L) 80.0 - 100.0 fL   MCH 21.8 (L) 26.0 - 34.0 pg   MCHC 30.3 30.0 - 36.0 g/dL   RDW 53.6 14.4 - 31.5 %   Platelets 260 150 - 400 K/uL   nRBC 0.0 0.0 - 0.2 %    Comment: Performed at [facility], [address], [place], [place] [address]    Medications:  Current Facility-Administered Medications  Medication Dose Route Frequency Provider Last Rate Last Admin  . 0.9 %  sodium chloride infusion   Intravenous Continuous Jamey Reas, MD 125 mL/hr at 05/08/19 0230 New Bag at 05/08/19 0230  . acetaminophen (TYLENOL) tablet 650 mg  650 mg Oral Q6H PRN Pearson Grippe, MD       Or  . acetaminophen (TYLENOL) suppository 650 mg  650 mg Rectal Q6H PRN Pearson Grippe, MD      . enoxaparin (LOVENOX) injection 40 mg  40 mg Subcutaneous Q24H Pearson Grippe, MD      . hydrALAZINE (APRESOLINE) injection 5 mg  5 mg Intravenous Q6H PRN Pearson Grippe, MD        Musculoskeletal: Strength & Muscle Tone: within normal limits Gait & Station: normal Patient leans: N/A  Psychiatric Specialty Exam: Physical Exam  Nursing note and vitals reviewed. Constitutional: She is oriented to person, place, and time. She appears well-developed.  HENT:  Head: Normocephalic.  Cardiovascular: Normal rate.  Respiratory: Effort normal.  Musculoskeletal:        General: Normal range of motion.     Cervical back: Normal range of motion.   Neurological: She is alert and oriented to person, place, and time.  Psychiatric: Her speech is normal and behavior is normal. Judgment normal. Cognition and memory are normal. She exhibits a depressed mood. She expresses suicidal ideation.    Review of Systems  Constitutional: Negative.   HENT: Negative.   Eyes: Negative.   Respiratory: Negative.   Cardiovascular: Negative.   Gastrointestinal: Negative.   Genitourinary: Negative.   Musculoskeletal: Negative.   Skin: Negative.   Neurological: Negative.   Psychiatric/Behavioral: Positive for sleep disturbance and suicidal ideas.    Blood pressure (!) 147/101, pulse 74, temperature 100.2 F (37.9 C), temperature source Oral, resp. rate 18, height 5\' 5"  (1.651 m), weight 101.2 kg, SpO2 99 %.Body mass index is 37.11 kg/m.  General Appearance: Casual and Fairly Groomed  Eye Contact:  Good  Speech:  Clear and Coherent and Normal Rate  Volume:  Normal  Mood:  Depressed  Affect:  Congruent and Depressed  Thought Process:  Coherent, Goal Directed and Descriptions of Associations: Intact  Orientation:  Full (Time, Place, and Person)  Thought Content:  WDL and Logical  Suicidal Thoughts:  No  Homicidal Thoughts:  No  Memory:  Immediate;   Good Recent;   Good Remote;   Good  Judgement:  Fair  Insight:  Fair  Psychomotor Activity:  Normal  Concentration:  Concentration: Good and Attention  Span: Good  Recall:  Good  Fund of Knowledge:  Good  Language:  Good  Akathisia:  No  Handed:  Right  AIMS (if indicated):     Assets:  Communication Skills Desire for Improvement Financial Resources/Insurance Housing Intimacy Leisure Time Physical Health Resilience Social Support Talents/Skills Transportation Vocational/Educational  ADL's:  Intact  Cognition:  WNL  Sleep:        Treatment Plan Summary: Inpatient psychiatric treatment recommended once medically clear.  Case discussed with Dr. Jama Flavorsobos. Recommend consider Zoloft 25  mg daily to treat anxiety and trazodone 50 mg nightly to address insomnia.  Disposition: Recommend psychiatric Inpatient admission when medically cleared. Supportive therapy provided about ongoing stressors. Discussed crisis plan, support from social network, calling 911, coming to the Emergency Department, and calling Suicide Hotline.  This service was provided via telemedicine using a 2-way, interactive audio and video technology.  Names of all persons participating in this telemedicine service and their role in this encounter. Name: Laverle PatterShanansha Gasparro Role: Patient  Name: Berneice Heinrichina Tate Role: FNP    Patrcia Dollyina L Tate, FNP 05/08/2019 12:58 PM   Attest to NP Note

## 2019-05-08 NOTE — ED Notes (Signed)
SDU Mother called to check on pt, number in the contacts

## 2019-05-08 NOTE — Progress Notes (Addendum)
PROGRESS NOTE    Krista Hamilton  ZOX:[id number] DOB: April 15, 1981 DOA: 05/07/2019 PCP: Patient, No Pcp Per    Brief Narrative:  Krista Hamilton  is a 39 y.o. female,  w hypertension, apparently had an argument with spouse and went into bedroom and took an undisclosed amount of amlodipine as well as doxycycline and excedrin.  Pt appeared lethargic per EMS and n/v, and brought to ER.  Pt states that she only took 2 amlodipine  Pt states that she took the amlodipine due to headache.   Per ER, poison control recommends observation for 12 hours.  Psychiatry was consulted   Consultants:   psychiatry  Procedures: None  Antimicrobials:   None   Subjective: Pt seen and examined. Sitter at bedside. She has no complaints.   Objective: Vitals:   05/08/19 0100 05/08/19 0222 05/08/19 0655 05/08/19 1109  BP:  (!) 135/91 (!) 149/100 (!) 147/101  Pulse: 78 (!) 58 72 74  Resp: 16 16 19 18   Temp:  98 F (36.7 C) 98.7 F (37.1 C) 100.2 F (37.9 C)  TempSrc:  Oral Oral Oral  SpO2: 100% 100% 99% 99%  Weight:   101.2 kg   Height:       No intake or output data in the 24 hours ending 05/08/19 1657 Filed Weights   05/07/19 2147 05/08/19 0655  Weight: 100 kg 101.2 kg    Examination:  General exam: Appears calm and comfortable , nad Respiratory system: Clear to auscultation. Respiratory effort normal. Cardiovascular system: S1 & S2 heard, RRR. No murmurs, rubs, gallops or clicks.  Gastrointestinal system: Abdomen is nondistended, soft and nontender.  Normal bowel sounds heard. Central nervous system: Alert and oriented. No focal neurological deficits. Extremities: No pedal edema. Skin: warm, dry Psychiatry:  Mood & affect appropriate in current setting.     Data Reviewed: I have personally reviewed following labs and imaging studies  CBC: Recent Labs  Lab 05/07/19 2151 05/08/19 0307  WBC 7.2 6.2  NEUTROABS 2.0  --   HGB 14.0 13.0  HCT 46.4* 42.9  MCV 71.5* 71.9*  PLT 268  260   Basic Metabolic Panel: Recent Labs  Lab 05/07/19 2151 05/08/19 0307  NA 137 138  K 4.1 4.4  CL 105 106  CO2 24 24  GLUCOSE 91 82  BUN 7 7  CREATININE 1.35* 1.17*  CALCIUM 9.2 9.2   GFR: Estimated Creatinine Clearance: 78.4 mL/min (A) (by C-G formula based on SCr of 1.17 mg/dL (H)). Liver Function Tests: Recent Labs  Lab 05/07/19 2151 05/08/19 0307  AST 22 17  ALT 14 13  ALKPHOS 71 63  BILITOT 0.7 1.1  PROT 7.2 6.5  ALBUMIN 4.3 3.7   No results for input(s): LIPASE, AMYLASE in the last 168 hours. No results for input(s): AMMONIA in the last 168 hours. Coagulation Profile: No results for input(s): INR, PROTIME in the last 168 hours. Cardiac Enzymes: No results for input(s): CKTOTAL, CKMB, CKMBINDEX, TROPONINI in the last 168 hours. BNP (last 3 results) No results for input(s): PROBNP in the last 8760 hours. HbA1C: No results for input(s): HGBA1C in the last 72 hours. CBG: Recent Labs  Lab 05/07/19 2221  GLUCAP 70   Lipid Profile: No results for input(s): CHOL, HDL, LDLCALC, TRIG, CHOLHDL, LDLDIRECT in the last 72 hours. Thyroid Function Tests: No results for input(s): TSH, T4TOTAL, FREET4, T3FREE, THYROIDAB in the last 72 hours. Anemia Panel: No results for input(s): VITAMINB12, FOLATE, FERRITIN, TIBC, IRON, RETICCTPCT in the last 72  hours. Sepsis Labs: No results for input(s): PROCALCITON, LATICACIDVEN in the last 168 hours.  Recent Results (from the past 240 hour(s))  SARS CORONAVIRUS 2 (TAT 6-24 HRS) Nasopharyngeal Nasopharyngeal Swab     Status: None   Collection Time: 05/07/19 10:22 PM   Specimen: Nasopharyngeal Swab  Result Value Ref Range Status   SARS Coronavirus 2 NEGATIVE NEGATIVE Final    Comment: (NOTE) SARS-CoV-2 target nucleic acids are NOT DETECTED. The SARS-CoV-2 RNA is generally detectable in upper and lower respiratory specimens during the acute phase of infection. Negative results do not preclude SARS-CoV-2 infection, do not rule  out co-infections with other pathogens, and should not be used as the sole basis for treatment or other patient management decisions. Negative results must be combined with clinical observations, patient history, and epidemiological information. The expected result is Negative. Fact Sheet for Patients: SugarRoll.be Fact Sheet for Healthcare Providers: https://www.woods-mathews.com/ This test is not yet approved or cleared by the [place] FDA and  has been authorized for detection and/or diagnosis of SARS-CoV-2 by FDA under an Emergency Use Authorization (EUA). This EUA will remain  in effect (meaning this test can be used) for the duration of the COVID-19 declaration under Section 56 4(b)(1) of the Act, 21 U.S.C. section 360bbb-3(b)(1), unless the authorization is terminated or revoked sooner. Performed at [facility], [place] [address], [place], [place] [address]          Radiology Studies: No results found.      Scheduled Meds: . enoxaparin (LOVENOX) injection  40 mg Subcutaneous Q24H   Continuous Infusions: . sodium chloride 125 mL/hr at 05/08/19 1522    Assessment & Plan:   Principal Problem:   Overdose   1.Overdose 1:1 for safety Continue to monitor.  Psych was consulted-input appreciated.  Recommended Zoloft 25 mg daily anxiety and trazodone 50 mg at bedtime for insomnia.  Also recommended inpatient psychiatric treatment once medically stable.   nml tylenol level    2.Hypertension Hydralazine increase to 10mg   iv q6h prn sbp 160 or above for tonight.  Start amlodipine 5mg  in am and titrate as tolerated   3.  Insomnia we will start on trazodone 50 mg nightly per psychiatry recommendation  4. anxiety-we will start Zoloft 25 mg daily per psychiatry recommendation  DVT prophylaxis: lovenox Code Status:full Family Communication: None at bedside Disposition Plan: Once medically stable is which should be  in a.m. once amlodipine is started and if blood pressure is stable can be transferred to inpatient psychiatric treatment.       LOS: 0 days   Time spent: 45 minutes with more than 50% COC    Nolberto Hanlon, MD [facility] Pager [phone]  If 7PM-7AM, please contact night-coverage [email or web address] Password [facility] 05/08/2019, 4:57 PM

## 2019-05-09 ENCOUNTER — Encounter (HOSPITAL_COMMUNITY): Payer: Self-pay | Admitting: Psychiatry

## 2019-05-09 ENCOUNTER — Other Ambulatory Visit: Payer: Self-pay

## 2019-05-09 ENCOUNTER — Inpatient Hospital Stay (HOSPITAL_COMMUNITY)
Admission: AD | Admit: 2019-05-09 | Discharge: 2019-05-11 | DRG: 885 | Disposition: A | Discharge status: Home / Self Care | Payer: Medicaid Other | Source: Intra-hospital | Attending: Psychiatry | Admitting: Psychiatry

## 2019-05-09 DIAGNOSIS — E282 Polycystic ovarian syndrome: Secondary | ICD-10-CM | POA: Diagnosis present

## 2019-05-09 DIAGNOSIS — T461X1A Poisoning by calcium-channel blockers, accidental (unintentional), initial encounter: Secondary | ICD-10-CM | POA: Diagnosis present

## 2019-05-09 DIAGNOSIS — T391X1A Poisoning by 4-Aminophenol derivatives, accidental (unintentional), initial encounter: Secondary | ICD-10-CM | POA: Diagnosis present

## 2019-05-09 DIAGNOSIS — F339 Major depressive disorder, recurrent, unspecified: Secondary | ICD-10-CM | POA: Diagnosis present

## 2019-05-09 DIAGNOSIS — F17290 Nicotine dependence, other tobacco product, uncomplicated: Secondary | ICD-10-CM | POA: Diagnosis present

## 2019-05-09 DIAGNOSIS — I10 Essential (primary) hypertension: Secondary | ICD-10-CM | POA: Diagnosis present

## 2019-05-09 DIAGNOSIS — T50904A Poisoning by unspecified drugs, medicaments and biological substances, undetermined, initial encounter: Secondary | ICD-10-CM | POA: Diagnosis not present

## 2019-05-09 DIAGNOSIS — F332 Major depressive disorder, recurrent severe without psychotic features: Secondary | ICD-10-CM | POA: Diagnosis not present

## 2019-05-09 HISTORY — DX: Anxiety disorder, unspecified: F41.9

## 2019-05-09 HISTORY — DX: Other specified health status: Z78.9

## 2019-05-09 MED ORDER — TRAZODONE HCL 50 MG PO TABS
50.0000 mg | ORAL_TABLET | Freq: Every evening | ORAL | Status: DC | PRN
  Administered 2019-05-09: 50 mg via ORAL
  Filled 2019-05-09: qty 1

## 2019-05-09 MED ORDER — TRAZODONE HCL 50 MG PO TABS: 50.0000 mg | ORAL_TABLET | Freq: Every day | ORAL | 0 refills | Status: DC

## 2019-05-09 MED ORDER — SERTRALINE HCL 25 MG PO TABS: 25.0000 mg | ORAL_TABLET | Freq: Every day | ORAL | 0 refills | Status: DC

## 2019-05-09 MED ORDER — AMLODIPINE BESYLATE 5 MG PO TABS: 5.0000 mg | ORAL_TABLET | Freq: Every day | ORAL | 0 refills | Status: AC

## 2019-05-09 NOTE — Progress Notes (Signed)
CSW was alerted that patient needed a [facility] placement and ready for discharge. CSW spoke with Sharyl Nimrod at [facility] and she was able to secure a bed at [facility]. CSW assisted with calling safe transport. CSW updated RN and Sharyl Nimrod stated that she would put in a note as well with the call to report number.

## 2019-05-09 NOTE — Progress Notes (Signed)
  Admission DAR NOTE:  Pt presented as  IVC from [facility]. Alert and oriented x 4. Denies SI/ HI, VH or Ah. Denies pain at present. She presents with depressed, sad mood, logical speech and fair eye contact. Pt stated that she didn't know she was IVC. She also stated that " my husband doesn't want me to be in the hospital but I feel I need it to get help and someone to talk to. Pt stated she has history of anxiety and depression. She had been having allergies and she said she was taking benadryl for allergies and realized that it was effective for her sleep on the said date Pt stated "I took 2 of my blood pressure pills and a few benadryl and slept" "My husband said he could not wake me up" Her stressors are death of her brother and father a few years ago and financial issues. Pt stated she used to take anxiety medications when she was in her twenties but stopped because she does not like to take pills.   Skin assessment done and belongings searched per protocol. Items deemed contraband secured in locker 34. Unit orientation and routine discussed, Care Plan reviewed as well and Patient verbalized understanding. Fluids and lunch offered, tolerated well. Q15 minutes safety checks initiated without self harm gestures. Support and encouragement provided.

## 2019-05-09 NOTE — Progress Notes (Signed)
Gave report on patient to Marzetta Board, RN at [facility].

## 2019-05-09 NOTE — Progress Notes (Signed)
Called to give report for third time, told to call back later. Called 4th time, no one answered phone. Charge RN, Designer, fashion/clothing, notified. Social work notified again.

## 2019-05-09 NOTE — Progress Notes (Signed)
Per Katha Cabal, pt has been accepted to [facility]. Bed number is 302-1. Accepting provider is  Clemon Chambers, NP. Attending provider will be Dr Jama Flavors. Number for report is [phone]. The pt may arrive as soon as transport is set up.    Ruthann Cancer MSW, [facility] Social Worker Disposition  [facility] Ph: [phone] Fax: [phone]  05/09/2019 11:20 AM

## 2019-05-09 NOTE — Progress Notes (Signed)
Transport arrived for patient. Called Behavioral Health twice to give report and they refused to take report stating they did not know they were getting her. Social work notified

## 2019-05-09 NOTE — Discharge Summary (Signed)
Discharge Summary  Krista Hamilton HCW:[id number] DOB: 1980-07-29  PCP: Patient, No Pcp Per  Admit date: 05/07/2019 Discharge date: 05/09/2019   Recommendations for Outpatient Follow-up:  1. Anticipate inpatient psych workup and treatment   Discharge Diagnoses:  Active Hospital Problems   Diagnosis Date Noted  . Overdose 05/07/2019    Resolved Hospital Problems  No resolved problems to display.    Discharge Condition: Stable   Diet recommendation: Regular   Vitals:   05/09/19 0623 05/09/19 0950  BP: (!) 137/99 135/90  Pulse: 72   Resp: 18   Temp: 99 F (37.2 C)   SpO2: 98%     HPI and Brief Hospital Course:  Krista Hamilton a38 y.o.female,w hypertension, apparently had an argument with spouse and went into bedroom and took an undisclosed amount of amlodipine as well as doxycycline and excedrin. Pt appeared lethargic per EMS and n/v, and brought to ER.  Pt states that she only took 2 amlodipine Pt states that she took the amlodipine due to headache. Per ER, poison control recommends observation for 12 hours.  She has been stable, denies current SI/HI, BP at goal today. Psychiatry was consulted and will evaluate patient for anticipated inpatient psych treatment. She is medically cleared started on medications below as per psychiatry.  Discharge details, plan of care and follow up instructions were discussed with patient and any available family or care providers. Patient and family are in agreement with discharge from the hospital today and all questions were answered to their satisfaction.  Consultations:  Psych   Discharge Exam: BP 135/90   Pulse 72   Temp 99 F (37.2 C) (Oral)   Resp 18   Ht 5\' 5"  (1.651 m)   Wt 102 kg   SpO2 98%   BMI 37.42 kg/m  General:  Alert, oriented, calm, in no acute distress  Eyes: EOMI, clear sclerea Neck: supple, no masses, trachea mildline  Cardiovascular: RRR, no murmurs or rubs, no peripheral edema  Respiratory:  clear to auscultation bilaterally, no wheezes, no crackles  Abdomen: soft, nontender, nondistended, normal bowel tones heard  Skin: dry, no rashes  Musculoskeletal: no joint effusions, normal range of motion  Psychiatric: appropriate affect, normal speech  Neurologic: extraocular muscles intact, clear speech, moving all extremities with intact sensorium    Discharge Instructions You were cared for by a hospitalist during your hospital stay. If you have any questions about your discharge medications or the care you received while you were in the hospital after you are discharged, you can call the unit and asked to speak with the hospitalist on call if the hospitalist that took care of you is not available. Once you are discharged, your primary care physician will handle any further medical issues. Please note that NO REFILLS for any discharge medications will be authorized once you are discharged, as it is imperative that you return to your primary care physician (or establish a relationship with a primary care physician if you do not have one) for your aftercare needs so that they can reassess your need for medications and monitor your lab values.  Discharge Instructions    Diet - low sodium heart healthy   Complete by: As directed    Increase activity slowly   Complete by: As directed      Allergies as of 05/09/2019   No Known Allergies     Medication List    TAKE these medications   amLODipine 5 MG tablet Commonly known as: NORVASC Take 1 tablet (5  mg total) by mouth daily. Start taking on: May 10, 2019   aspirin-acetaminophen-caffeine 250-250-65 MG tablet Commonly known as: EXCEDRIN MIGRAINE Take 1 tablet by mouth every 6 (six) hours as needed for headache.   sertraline 25 MG tablet Commonly known as: ZOLOFT Take 1 tablet (25 mg total) by mouth daily. Start taking on: May 10, 2019   traZODone 50 MG tablet Commonly known as: DESYREL Take 1 tablet (50 mg total) by  mouth at bedtime.      No Known Allergies    The results of significant diagnostics from this hospitalization (including imaging, microbiology, ancillary and laboratory) are listed below for reference.    Significant Diagnostic Studies: No results found.  Microbiology: Recent Results (from the past 240 hour(s))  SARS CORONAVIRUS 2 (TAT 6-24 HRS) Nasopharyngeal Nasopharyngeal Swab     Status: None   Collection Time: 05/07/19 10:22 PM   Specimen: Nasopharyngeal Swab  Result Value Ref Range Status   SARS Coronavirus 2 NEGATIVE NEGATIVE Final    Comment: (NOTE) SARS-CoV-2 target nucleic acids are NOT DETECTED. The SARS-CoV-2 RNA is generally detectable in upper and lower respiratory specimens during the acute phase of infection. Negative results do not preclude SARS-CoV-2 infection, do not rule out co-infections with other pathogens, and should not be used as the sole basis for treatment or other patient management decisions. Negative results must be combined with clinical observations, patient history, and epidemiological information. The expected result is Negative. Fact Sheet for Patients: HairSlick.no Fact Sheet for Healthcare Providers: quierodirigir.com This test is not yet approved or cleared by the [place] FDA and  has been authorized for detection and/or diagnosis of SARS-CoV-2 by FDA under an Emergency Use Authorization (EUA). This EUA will remain  in effect (meaning this test can be used) for the duration of the COVID-19 declaration under Section 56 4(b)(1) of the Act, 21 U.S.C. section 360bbb-3(b)(1), unless the authorization is terminated or revoked sooner. Performed at [facility], [address], [place], [place] [address]      Labs: Basic Metabolic Panel: Recent Labs  Lab 05/07/19 2151 05/08/19 0307  NA 137 138  K 4.1 4.4  CL 105 106  CO2 24 24  GLUCOSE 91 82  BUN 7 7  CREATININE  1.35* 1.17*  CALCIUM 9.2 9.2   Liver Function Tests: Recent Labs  Lab 05/07/19 2151 05/08/19 0307  AST 22 17  ALT 14 13  ALKPHOS 71 63  BILITOT 0.7 1.1  PROT 7.2 6.5  ALBUMIN 4.3 3.7   No results for input(s): LIPASE, AMYLASE in the last 168 hours. No results for input(s): AMMONIA in the last 168 hours. CBC: Recent Labs  Lab 05/07/19 2151 05/08/19 0307  WBC 7.2 6.2  NEUTROABS 2.0  --   HGB 14.0 13.0  HCT 46.4* 42.9  MCV 71.5* 71.9*  PLT 268 260   Cardiac Enzymes: No results for input(s): CKTOTAL, CKMB, CKMBINDEX, TROPONINI in the last 168 hours. BNP: BNP (last 3 results) No results for input(s): BNP in the last 8760 hours.  ProBNP (last 3 results) No results for input(s): PROBNP in the last 8760 hours.  CBG: Recent Labs  Lab 05/07/19 2221  GLUCAP 70    Time spent: 31 minutes were spent in preparing this discharge including medication reconciliation, counseling, and coordination of care.  Signed:  Linder Prajapati Vergie Living, MD  [facility] 05/09/2019, 10:21 AM

## 2019-05-09 NOTE — Progress Notes (Signed)
Transfer report taken by Admin Coordinator on duty at 1310 hours  - from RN C. Dellmon

## 2019-05-09 NOTE — Plan of Care (Signed)
  Problem: Nutrition: Goal: Adequate nutrition will be maintained Outcome: Progressing   Problem: Safety: Goal: Ability to remain free from injury will improve Outcome: Progressing   

## 2019-05-10 DIAGNOSIS — F332 Major depressive disorder, recurrent severe without psychotic features: Secondary | ICD-10-CM

## 2019-05-10 MED ORDER — LORAZEPAM 0.5 MG PO TABS
0.5000 mg | ORAL_TABLET | Freq: Four times a day (QID) | ORAL | Status: DC | PRN
  Filled 2019-05-10: qty 1
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
## 2019-05-10 NOTE — Progress Notes (Signed)
   05/10/19 2134  Psych Admission Type (Psych Patients Only)  Admission Status Voluntary  Psychosocial Assessment  Patient Complaints None  Eye Contact Fair  Facial Expression Flat  Affect Other (Comment) (WNL)  Speech Logical/coherent  Interaction Assertive  Motor Activity Other (Comment) (WNL)  Appearance/Hygiene Unremarkable  Behavior Characteristics Cooperative  Mood Pleasant  Thought Process  Coherency WDL  Content WDL  Delusions WDL  Perception WDL  Hallucination None reported or observed  Judgment Poor  Confusion None  Danger to Self  Current suicidal ideation? Denies  Agreement Not to Harm Self Yes  Description of Agreement verbal  Danger to Others  Danger to Others None reported or observed   Pt denies SI, HI, AVH and pain. Pt contracts for safety.

## 2019-05-10 NOTE — Tx Team (Signed)
Interdisciplinary Treatment and Diagnostic Plan Update  05/10/2019 Time of Session: 9:10am Krista Hamilton MRN: [id number]  Principal Diagnosis: <principal problem not specified>  Secondary Diagnoses: Active Problems:   * No active hospital problems. *   Current Medications:  Current Facility-Administered Medications  Medication Dose Route Frequency Provider Last Rate Last Admin  . traZODone (DESYREL) tablet 50 mg  50 mg Oral QHS PRN Cobos, Myer Peer, MD   50 mg at 05/09/19 2234   PTA Medications: Medications Prior to Admission  Medication Sig Dispense Refill Last Dose  . amLODipine (NORVASC) 5 MG tablet Take 1 tablet (5 mg total) by mouth daily. 30 tablet 0   . aspirin-acetaminophen-caffeine (EXCEDRIN MIGRAINE) 250-250-65 MG tablet Take 1 tablet by mouth every 6 (six) hours as needed for headache.     . sertraline (ZOLOFT) 25 MG tablet Take 1 tablet (25 mg total) by mouth daily. 30 tablet 0   . traZODone (DESYREL) 50 MG tablet Take 1 tablet (50 mg total) by mouth at bedtime. 30 tablet 0     Patient Stressors:    Patient Strengths:    Treatment Modalities: Medication Management, Group therapy, Case management,  1 to 1 session with clinician, Psychoeducation, Recreational therapy.   Physician Treatment Plan for Primary Diagnosis: <principal problem not specified> Long Term Goal(s):     Short Term Goals:    Medication Management: Evaluate patient's response, side effects, and tolerance of medication regimen.  Therapeutic Interventions: 1 to 1 sessions, Unit Group sessions and Medication administration.  Evaluation of Outcomes: Not Met  Physician Treatment Plan for Secondary Diagnosis: Active Problems:   * No active hospital problems. *  Long Term Goal(s):     Short Term Goals:       Medication Management: Evaluate patient's response, side effects, and tolerance of medication regimen.  Therapeutic Interventions: 1 to 1 sessions, Unit Group sessions and Medication  administration.  Evaluation of Outcomes: Not Met   RN Treatment Plan for Primary Diagnosis: <principal problem not specified> Long Term Goal(s): Knowledge of disease and therapeutic regimen to maintain health will improve  Short Term Goals: Ability to participate in decision making will improve, Ability to verbalize feelings will improve, Ability to disclose and discuss suicidal ideas, Ability to identify and develop effective coping behaviors will improve and Compliance with prescribed medications will improve  Medication Management: RN will administer medications as ordered by provider, will assess and evaluate patient's response and provide education to patient for prescribed medication. RN will report any adverse and/or side effects to prescribing provider.  Therapeutic Interventions: 1 on 1 counseling sessions, Psychoeducation, Medication administration, Evaluate responses to treatment, Monitor vital signs and CBGs as ordered, Perform/monitor CIWA, COWS, AIMS and Fall Risk screenings as ordered, Perform wound care treatments as ordered.  Evaluation of Outcomes: Not Met   LCSW Treatment Plan for Primary Diagnosis: <principal problem not specified> Long Term Goal(s): Safe transition to appropriate next level of care at discharge, Engage patient in therapeutic group addressing interpersonal concerns.  Short Term Goals: Engage patient in aftercare planning with referrals and resources  Therapeutic Interventions: Assess for all discharge needs, 1 to 1 time with Social worker, Explore available resources and support systems, Assess for adequacy in community support network, Educate family and significant other(s) on suicide prevention, Complete Psychosocial Assessment, Interpersonal group therapy.  Evaluation of Outcomes: Not Met   Progress in Treatment: Attending groups: No. New to unit  Participating in groups: No. Taking medication as prescribed: Yes. Toleration medication:  Yes. Family/Significant other  contact made: No, will contact:  if patient consents to collateral contacts Patient understands diagnosis: Yes. Discussing patient identified problems/goals with staff: Yes. Medical problems stabilized or resolved: Yes. Denies suicidal/homicidal ideation: Yes. Issues/concerns per patient self-inventory: No. Other:   New problem(s) identified: None   New Short Term/Long Term Goal(s): medication stabilization, elimination of SI thoughts, development of comprehensive mental wellness plan.    Patient Goals:  "I really don't know"   Discharge Plan or Barriers: Patient recently admitted. CSW will continue to follow and assess for appropriate referrals and possible discharge planning.    Reason for Continuation of Hospitalization: Anxiety Depression Medication stabilization Suicidal ideation  Estimated Length of Stay: 3-5 days   Attendees: Patient: Krista Hamilton  05/10/2019 10:16 AM  Physician: Dr. Neita Garnet, MD 05/10/2019 10:16 AM  Nursing: Vladimir Faster.Viona Gilmore, RN 05/10/2019 10:16 AM  RN Care Manager: 05/10/2019 10:16 AM  Social Worker: Radonna Ricker, LCSW 05/10/2019 10:16 AM  Recreational Therapist:  05/10/2019 10:16 AM  Other: Harriett Sine, NP 05/10/2019 10:16 AM  Other:  05/10/2019 10:16 AM  Other: 05/10/2019 10:16 AM    Scribe for Treatment Team: Marylee Floras, Sanders 05/10/2019 10:16 AM

## 2019-05-10 NOTE — Progress Notes (Signed)
Recreation Therapy Notes  Date:  1.25.21 Time: 0930 Location: [address] Group Room  Group Topic: Stress Management  Goal Area(s) Addresses:  Patient will identify positive stress management techniques. Patient will identify benefits of using stress management post d/c.  Intervention: Stress Management  Activity :  Meditation.  LRT played a meditation that focused on the making most of your day and exploring the possibilities each moment holds.  Patients were to listen and follow along as meditation played to engage in group.   Education:  Stress Management, Discharge Planning.   Education Outcome: Acknowledges Education  Clinical Observations/Feedback: Pt did not attend group activity.    Caroll Rancher, LRT/CTRS         Caroll Rancher A 05/10/2019 10:48 AM

## 2019-05-10 NOTE — BHH Suicide Risk Assessment (Signed)
[facility] Admission Suicide Risk Assessment   Nursing information obtained from:  Patient Demographic factors:  Adolescent or young adult, Unemployed, Low socioeconomic status Current Mental Status:  Suicidal ideation indicated by others Loss Factors:  Loss of significant relationship, Financial problems / change in socioeconomic status Historical Factors:  Prior suicide attempts, Impulsivity Risk Reduction Factors:  Responsible for children under 76 years of age, Living with another person, especially a relative  Total Time spent with patient: 45 minutes Principal Problem: Depression, Overdose  Diagnosis:  Active Problems:   * No active hospital problems. *  Subjective Data:   Continued Clinical Symptoms:  Alcohol Use Disorder Identification Test Final Score (AUDIT): 2 The "Alcohol Use Disorders Identification Test", Guidelines for Use in Primary Care, Second Edition.  World Science writer [facility]). Score between 0-7:  no or low risk or alcohol related problems. Score between 8-15:  moderate risk of alcohol related problems. Score between 16-19:  high risk of alcohol related problems. Score 20 or above:  warrants further diagnostic evaluation for alcohol dependence and treatment.   CLINICAL FACTORS:  39 year old female, presented to ED via EMS on 1/22 due to altered mental status following an overdose on amlodipine and Excedrin.  Initially presented lethargic and was admitted to medical unit for observation.  Patient reports she took 2 amlodipine's and 4 Excedrin (?)  As she had a headache and wanted to rest.  She currently denies she had any suicidal plan or intention.  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  She endorses some neurovegetative symptoms of depression.  She denies any suicidal ideations leading up to event.   Psychiatric Specialty Exam: Physical Exam  Review of  Systems  Blood pressure 131/89, pulse 77, temperature 98.2 F (36.8 C), temperature source Oral, resp. rate 18, height 5' 3.78" (1.62 m), weight 101.2 kg, SpO2 100 %.Body mass index is 38.54 kg/m.  See admit note MSE   COGNITIVE FEATURES THAT CONTRIBUTE TO RISK:  Closed-mindedness and Loss of executive function    SUICIDE RISK:   Moderate:  Frequent suicidal ideation with limited intensity, and duration, some specificity in terms of plans, no associated intent, good self-control, limited dysphoria/symptomatology, some risk factors present, and identifiable protective factors, including available and accessible social support.  PLAN OF CARE: Patient will be admitted to inpatient psychiatric unit for stabilization and safety. Will provide and encourage milieu participation. Provide medication management and maked adjustments as needed.  Will follow daily.    I certify that inpatient services furnished can reasonably be expected to improve the patient's condition.   Craige Cotta, MD 05/10/2019, 10:48 AM

## 2019-05-10 NOTE — Progress Notes (Signed)
Patient observed in bed with eyes closed. Respirations are even and non labored no distress noted. Monitoring continues.

## 2019-05-10 NOTE — BHH Group Notes (Signed)
LCSW Group Therapy Note 05/10/2019 1:45 PM  Type of Therapy and Topic: Group Therapy: Overcoming Obstacles  Participation Level: Did Not Attend  Description of Group:  In this group patients will be encouraged to explore what they see as obstacles to their own wellness and recovery. They will be guided to discuss their thoughts, feelings, and behaviors related to these obstacles. The group will process together ways to cope with barriers, with attention given to specific choices patients can make. Each patient will be challenged to identify changes they are motivated to make in order to overcome their obstacles. This group will be process-oriented, with patients participating in exploration of their own experiences as well as giving and receiving support and challenge from other group members.  Therapeutic Goals: 1. Patient will identify personal and current obstacles as they relate to admission. 2. Patient will identify barriers that currently interfere with their wellness or overcoming obstacles.  3. Patient will identify feelings, thought process and behaviors related to these barriers. 4. Patient will identify two changes they are willing to make to overcome these obstacles:   Summary of Patient Progress  Invited, chose not to attend.    Therapeutic Modalities:  Cognitive Behavioral Therapy Solution Focused Therapy Motivational Interviewing Relapse Prevention Therapy   Alcario Drought Clinical Social Worker

## 2019-05-11 DIAGNOSIS — F339 Major depressive disorder, recurrent, unspecified: Principal | ICD-10-CM

## 2019-05-11 LAB — TSH: TSH: 1.235 u[IU]/mL (ref 0.350–4.500)

## 2019-05-11 MED ORDER — AMLODIPINE BESYLATE 5 MG PO TABS
5.0000 mg | ORAL_TABLET | Freq: Every day | ORAL | Status: DC
  Filled 2019-05-11 (×2): qty 1
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
## 2019-05-11 NOTE — Progress Notes (Signed)
  [facility] Adult Case Management Discharge Plan :  Will you be returning to the same living situation after discharge:  Yes, home. At discharge, do you have transportation home?: Yes,  husband picking up. Do you have the ability to pay for your medications: Yes,  Medicaid.  Release of information consent forms completed and in the chart.  Patient to Follow up at: Follow-up Information    [facility] Follow up on 05/18/2019.   Why: You are scheduled for an appoinment on 05/18/19 at 1:30 pm.  This is a virtual appointment held via telephone.  Please have your insurance information and your discharge summary available. Contact information: [address] [place] [address] [phone]           Next level of care provider has access to [facility] Link:no  Safety Planning and Suicide Prevention discussed: Yes,  with husband.   Has patient been referred to the Quitline?: N/A patient is not a smoker  Patient has been referred for addiction treatment: Yes  Darreld Mclean, LCSWA 05/11/2019, 11:10 AM

## 2019-05-11 NOTE — Discharge Summary (Signed)
Physician Discharge Summary Note  Patient:  Krista Hamilton is an 39 y.o., female MRN:  [id number] DOB:  Aug 19, 1980 Patient phone:  [phone] (home)  Patient address:   [address] [place] [address],  Total Time spent with patient: 15 minutes  Date of Admission:  05/09/2019 Date of Discharge: 05/11/19  Reason for Admission:  Overdose on Norvasc and Excedrin  Principal Problem: Major depressive disorder, recurrent episode [facility]) Discharge Diagnoses: Principal Problem:   Major depressive disorder, recurrent episode ([place])   Past Psychiatric History: Denies history of prior psychiatric admissions . Reports history of a suicide attempt at age 21,  by overdosing on Acetaminophen .Denies history of self cutting or self injurious behaviors. Denies history of psychosis. Reports history of prior depressive episode around age 5. Denies history of mania or hypomania. As above, does not currently endorse PTSD symptoms.   Past Medical History:  Past Medical History:  Diagnosis Date  . Anxiety   . Depression   . Hypertension   . Medical history non-contributory   . PCOS (polycystic ovarian syndrome)     Past Surgical History:  Procedure Laterality Date  . HEMORROIDECTOMY     Family History:  Family History  Problem Relation Age of Onset  . Pancreatic cancer Father    Family Psychiatric  History: no known mental illness in family. A maternal cousin committed suicide Social History:  Social History   Substance and Sexual Activity  Alcohol Use Yes   Comment: occ     Social History   Substance and Sexual Activity  Drug Use Not Currently    Social History   Socioeconomic History  . Marital status: Legally Separated    Spouse name: Not on file  . Number of children: Not on file  . Years of education: Not on file  . Highest education level: Not on file  Occupational History  . Not on file  Tobacco Use  . Smoking status: Current Some Day Smoker    Types: Cigars  .  Smokeless tobacco: Never Used  Substance and Sexual Activity  . Alcohol use: Yes    Comment: occ  . Drug use: Not Currently  . Sexual activity: Yes    Birth control/protection: Condom  Other Topics Concern  . Not on file  Social History Narrative  . Not on file   Social Determinants of Health   Financial Resource Strain:   . Difficulty of Paying Living Expenses: Not on file  Food Insecurity:   . Worried About Charity fundraiser in the Last Year: Not on file  . Ran Out of Food in the Last Year: Not on file  Transportation Needs:   . Lack of Transportation (Medical): Not on file  . Lack of Transportation (Non-Medical): Not on file  Physical Activity:   . Days of Exercise per Week: Not on file  . Minutes of Exercise per Session: Not on file  Stress:   . Feeling of Stress : Not on file  Social Connections:   . Frequency of Communication with Friends and Family: Not on file  . Frequency of Social Gatherings with Friends and Family: Not on file  . Attends Religious Services: Not on file  . Active Member of Clubs or Organizations: Not on file  . Attends Archivist Meetings: Not on file  . Marital Status: Not on file    Hospital Course:  From admission H&P: 39 year old female, presented to ED via EMS on 1/22 due to altered  mental status, following overdose on amlodipine and Excedrin. Initially presented lethargic. Chart notes indicate patient had taken unknown amount of above medications following an argument with SO. Today patient denies suicidal intent, states " I had a headache, so I thought it was my blood pressure so I took 2 of the blood pressure medicine and 4 of the other one ". States her recollection following overdose is fragmented and states " I kind of woke up at the hospital after that". She was initially admitted to medical unit for observation following overdose , transferred to [facility] on medical clearance. Patient reports she has been depressed and " stressed out"  recently and describes financial employment /stressors ( self employed), States she continues to grieve the death of her father who passed away several years ago from pancreatic cancer.  She denies having had any suicidal ideations leading up to above overdose. Endorses some neuro-vegetative symptoms as below. Denies psychotic symptoms. *At her request and with her expressed consent I spoke with her husband, who wanted clarification regarding why patient was transferred from main hospital to [facility] and regarding expected length of stay.  He states he does not think that she overdosed intentionally.  Ms. Bures was admitted after overdose on unknown number of Norvasc and Excedrin. She remained on the [facility] unit for two days. She declined psychotropic medications. She declined to participate in group therapy on the unit. She denied suicidal intent behind overdose and stated that she was trying to treat a headache when she took too many pills. She has shown stable mood, affect, sleep, and interaction. She denies any SI/HI/AVH and contracts for safety. Collateral information is obtained from her husband, who denies safety concerns for discharge. She is discharging on the medications listed below. She agrees to follow up at [facility] (see below). Her husband is picking her up for discharge home.  Physical Findings: AIMS: Facial and Oral Movements Muscles of Facial Expression: None, normal Lips and Perioral Area: None, normal Jaw: None, normal Tongue: None, normal,Extremity Movements Upper (arms, wrists, hands, fingers): None, normal Lower (legs, knees, ankles, toes): None, normal, Trunk Movements Neck, shoulders, hips: None, normal, Overall Severity Severity of abnormal movements (highest score from questions above): None, normal Incapacitation due to abnormal movements: None, normal Patient's awareness of abnormal movements (rate only patient's report): No Awareness, Dental Status Current problems with teeth  and/or dentures?: No Does patient usually wear dentures?: No  CIWA:    COWS:     Musculoskeletal: Strength & Muscle Tone: within normal limits Gait & Station: normal Patient leans: N/A  Psychiatric Specialty Exam: Physical Exam  Nursing note and vitals reviewed. Constitutional: She is oriented to person, place, and time. She appears well-developed and well-nourished.  Cardiovascular: Normal rate.  Respiratory: Effort normal.  Neurological: She is alert and oriented to person, place, and time.    Review of Systems  Constitutional: Negative.   Respiratory: Negative for cough and shortness of breath.   Gastrointestinal: Negative for nausea and vomiting.  Neurological: Negative for headaches.  Psychiatric/Behavioral: Negative for agitation, behavioral problems, dysphoric mood, hallucinations, self-injury, sleep disturbance and suicidal ideas. The patient is not nervous/anxious and is not hyperactive.     Blood pressure 114/89, pulse 97, temperature 98.2 F (36.8 C), temperature source Oral, resp. rate 18, height 5' 3.78" (1.62 m), weight 101.2 kg, SpO2 100 %.Body mass index is 38.54 kg/m.  See MD's discharge SRA      Has this patient used any form of tobacco in the last 30  days? (Cigarettes, Smokeless Tobacco, Cigars, and/or Pipes)  No  Blood Alcohol level:  Lab Results  Component Value Date   ETH <10 05/07/2019    Metabolic Disorder Labs:  No results found for: HGBA1C, MPG No results found for: PROLACTIN No results found for: CHOL, TRIG, HDL, CHOLHDL, VLDL, LDLCALC  See Psychiatric Specialty Exam and Suicide Risk Assessment completed by Attending Physician prior to discharge.  Discharge destination:  Home  Is patient on multiple antipsychotic therapies at discharge:  No   Has Patient had three or more failed trials of antipsychotic monotherapy by history:  No  Recommended Plan for Multiple Antipsychotic Therapies: NA  Discharge Instructions    Discharge  instructions   Complete by: As directed    Patient is instructed to take all prescribed medications as recommended. Report any side effects or adverse reactions to your outpatient psychiatrist. Patient is instructed to abstain from alcohol and illegal drugs while on prescription medications. In the event of worsening symptoms, patient is instructed to call the crisis hotline, 911, or go to the nearest emergency department for evaluation and treatment.     Allergies as of 05/11/2019   No Known Allergies     Medication List    STOP taking these medications   aspirin-acetaminophen-caffeine 250-250-65 MG tablet Commonly known as: EXCEDRIN MIGRAINE   sertraline 25 MG tablet Commonly known as: ZOLOFT   traZODone 50 MG tablet Commonly known as: DESYREL     TAKE these medications     Indication  amLODipine 5 MG tablet Commonly known as: NORVASC Take 1 tablet (5 mg total) by mouth daily.  Indication: High Blood Pressure Disorder      Follow-up Information    [facility] Follow up on 05/18/2019.   Why: You are scheduled for an appoinment on 05/18/19 at 1:30 pm.  This is a virtual appointment held via telephone.  Please have your insurance information and your discharge summary available. Contact information: [address] [place] [address] [phone]           Follow-up recommendations: Activity as tolerated. Diet as recommended by primary care physician. Keep all scheduled follow-up appointments as recommended.   Comments:   Patient is instructed to take all prescribed medications as recommended. Report any side effects or adverse reactions to your outpatient psychiatrist. Patient is instructed to abstain from alcohol and illegal drugs while on prescription medications. In the event of worsening symptoms, patient is instructed to call the crisis hotline, 911, or go to the nearest emergency department for evaluation and treatment.  Signed: Aldean Baker, NP 05/11/2019,  1:33 PM

## 2019-05-11 NOTE — [facility] Suicide Risk Assessment (Signed)
[facility] Discharge Suicide Risk Assessment   Principal Problem: <principal problem not specified> Discharge Diagnoses: Active Problems:   * No active hospital problems. *   Total Time spent with patient: 20 minutes  Musculoskeletal: Strength & Muscle Tone: within normal limits Gait & Station: normal Patient leans: N/A  Psychiatric Specialty Exam: Review of Systems  All other systems reviewed and are negative.   Blood pressure 114/89, pulse 97, temperature 98.2 F (36.8 C), temperature source Oral, resp. rate 18, height 5' 3.78" (1.62 m), weight 101.2 kg, SpO2 100 %.Body mass index is 38.54 kg/m.  General Appearance: Casual  Eye Contact::  Fair  Speech:  Normal Rate409  Volume:  Normal  Mood:  Euthymic  Affect:  Congruent  Thought Process:  Coherent and Descriptions of Associations: Intact  Orientation:  Full (Time, Place, and Person)  Thought Content:  Logical  Suicidal Thoughts:  No  Homicidal Thoughts:  No  Memory:  Immediate;   Fair Recent;   Fair Remote;   Fair  Judgement:  Fair  Insight:  Fair  Psychomotor Activity:  Normal  Concentration:  Fair  Recall:  Good  Fund of Knowledge:Good  Language: Good  Akathisia:  Negative  Handed:  Right  AIMS (if indicated):     Assets:  Desire for Improvement Housing Resilience Social Support Talents/Skills Transportation Vocational/Educational  Sleep:  Number of Hours: 6.75  Cognition: WNL  ADL's:  Intact   Mental Status Per Nursing Assessment::   On Admission:  Suicidal ideation indicated by others  Demographic Factors:  NA  Loss Factors: NA  Historical Factors: Impulsivity  Risk Reduction Factors:   Responsible for children under 38 years of age, Sense of responsibility to family, Employed, Living with another person, especially a relative and Positive social support  Continued Clinical Symptoms:  Depression:   Impulsivity  Cognitive Features That Contribute To Risk:  None    Suicide Risk:  Minimal:  No identifiable suicidal ideation.  Patients presenting with no risk factors but with morbid ruminations; may be classified as minimal risk based on the severity of the depressive symptoms    Plan Of Care/Follow-up recommendations:  Activity:  ad lib  Antonieta Pert, MD 05/11/2019, 10:12 AM

## 2019-05-11 NOTE — [facility] Suicide Risk Assessment (Signed)
[facility] INPATIENT:  Family/Significant Other Suicide Prevention Education  Suicide Prevention Education:  Education Completed; with husband, Derek Mound [phone]) has been identified by the patient as the family member/significant other with whom the patient will be residing, and identified as the person(s) who will aid the patient in the event of a mental health crisis (suicidal ideations/suicide attempt).  With written consent from the patient, the family member/significant other has been provided the following suicide prevention education, prior to the and/or following the discharge of the patient.  The suicide prevention education provided includes the following:  Suicide risk factors  Suicide prevention and interventions  National Suicide Hotline telephone number  [facility] assessment telephone number  [facility] Emergency Assistance 911  [facility] and/or Residential Mobile Crisis Unit telephone number  Request made of family/significant other to:  Remove weapons (e.g., guns, rifles, knives), all items previously/currently identified as safety concern.    Remove drugs/medications (over-the-counter, prescriptions, illicit drugs), all items previously/currently identified as a safety concern.  The family member/significant other verbalizes understanding of the suicide prevention education information provided.  The family member/significant other agrees to remove the items of safety concern listed above.   Jeannett Senior reports that he does not have any concerns regarding the patient discharging home. He reports "I am ready for my wife to come home. She would never do anything to hurt herself." Jeannett Senior states the patient accidentally took too much medication when treating her headache.   CSW explained that the patient was being referred to an outpatient provider for continuity of care. Jeannett Senior expressed understanding. CSW will continue to follow for a safe  discharge.     Maeola Sarah 05/11/2019, 9:49 AM

## 2020-07-25 IMAGING — CT CT ABD-PELV W/ CM
2 of 4 series · 16 of 46 positions shown, 18 images · IV contrast (omnipaque)
Comparison: None.

CLINICAL DATA: Abdominal pain for 2 days. Constipation for 1 day.
History of polycystic ovarian syndrome.

EXAM:
CT ABDOMEN AND PELVIS WITH CONTRAST
TECHNIQUE: Multidetector CT imaging of the abdomen and pelvis was performed
using the standard protocol following bolus administration of
intravenous contrast.
CONTRAST:  100mL OMNIPAQUE IOHEXOL 300 MG/ML  SOLN

[Series 3: abdomen 5.0 · axial · 0.84mm/px · z∈[-480,-105]mm · 13 of 87 slices shown, 15 images]
[im 6/87  soft-tissue]
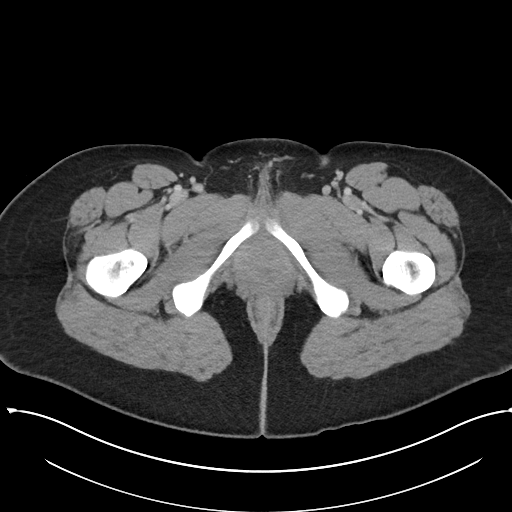
[im 6/87  bone]
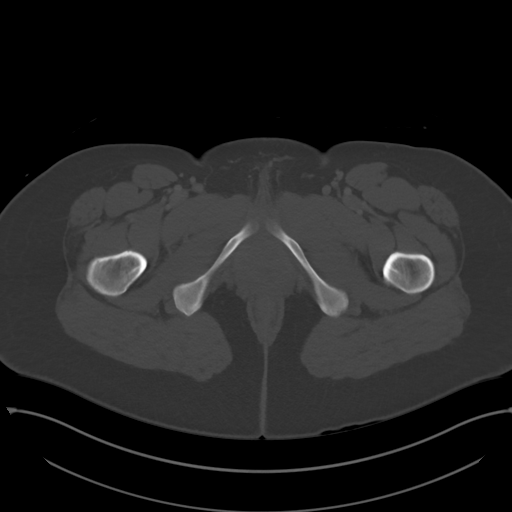
[im 12/87  soft-tissue]
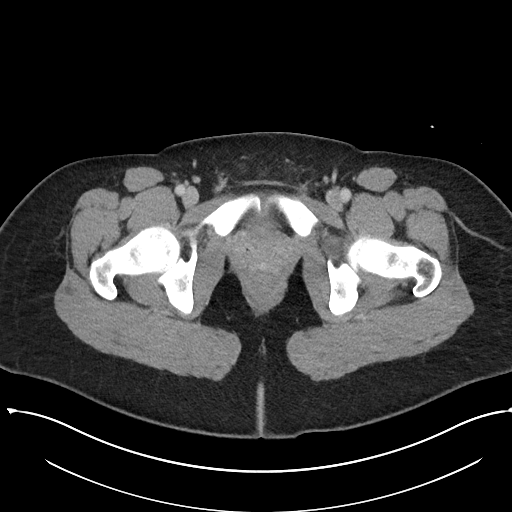
[im 18/87  soft-tissue]
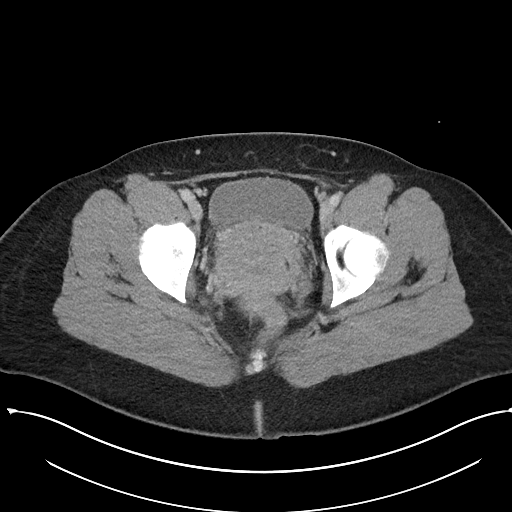
[im 23/87  soft-tissue]
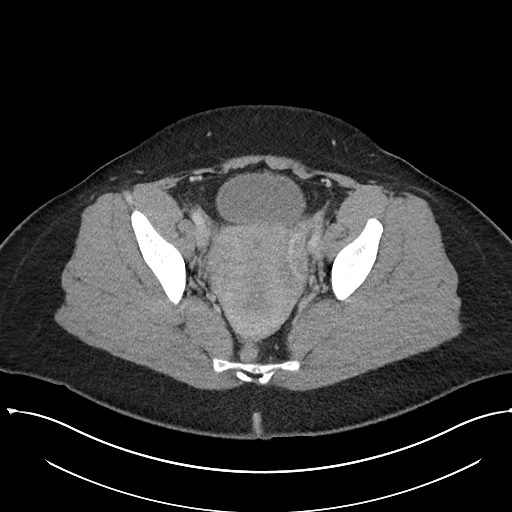
[im 29/87  soft-tissue]
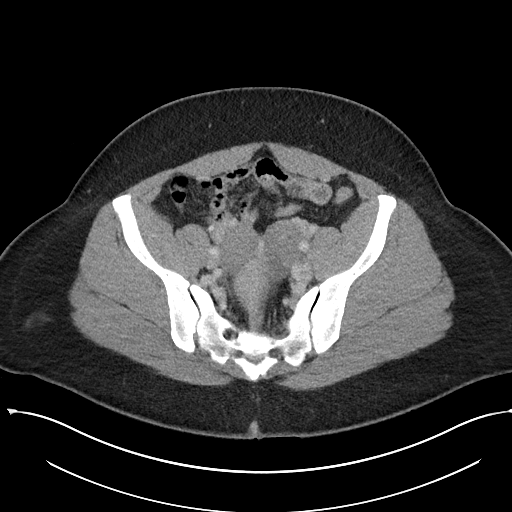
[im 35/87  soft-tissue]
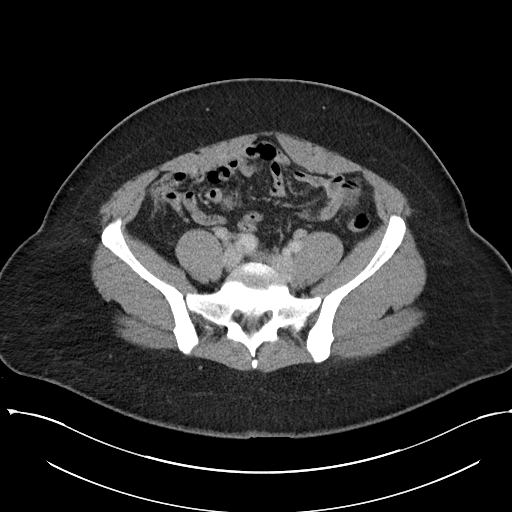
[im 46/87  soft-tissue]
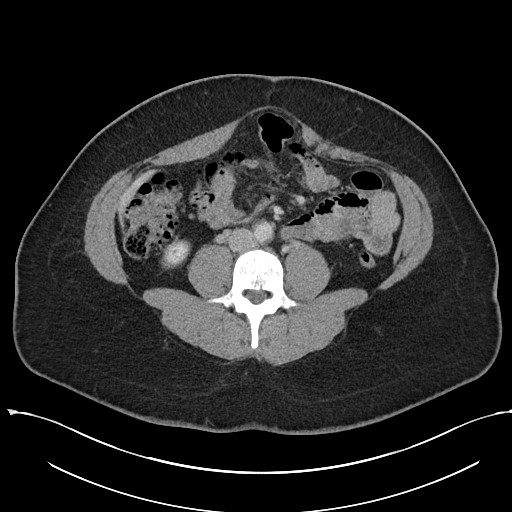
[im 52/87  soft-tissue]
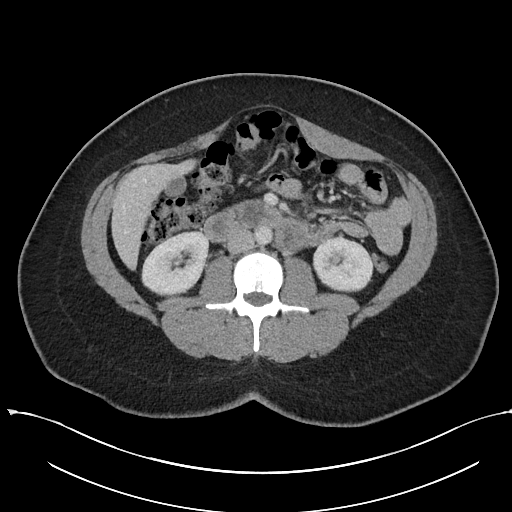
[im 58/87  soft-tissue]
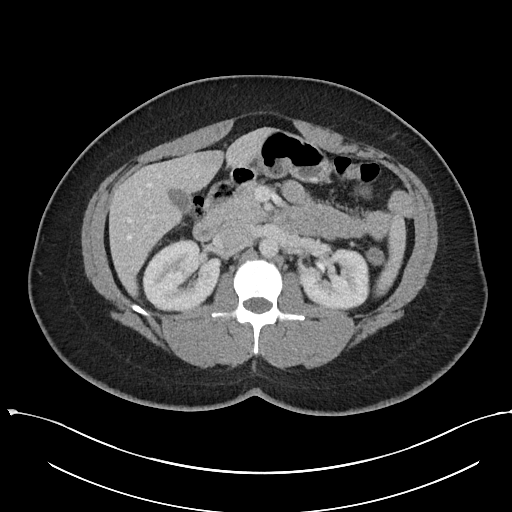
[im 58/87  bone]
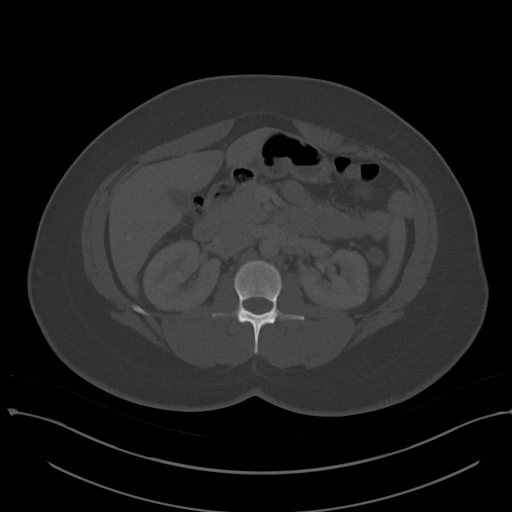
[im 64/87  soft-tissue]
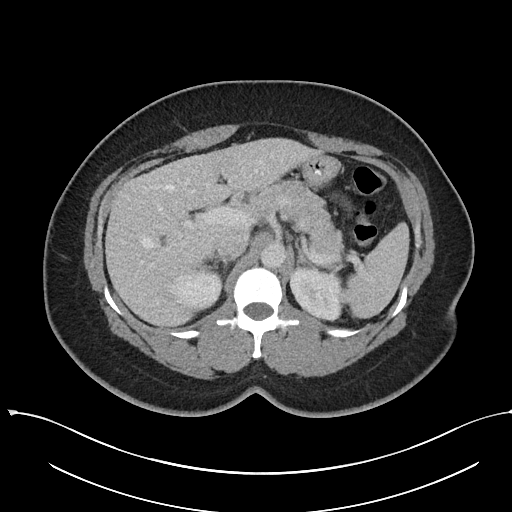
[im 69/87  soft-tissue]
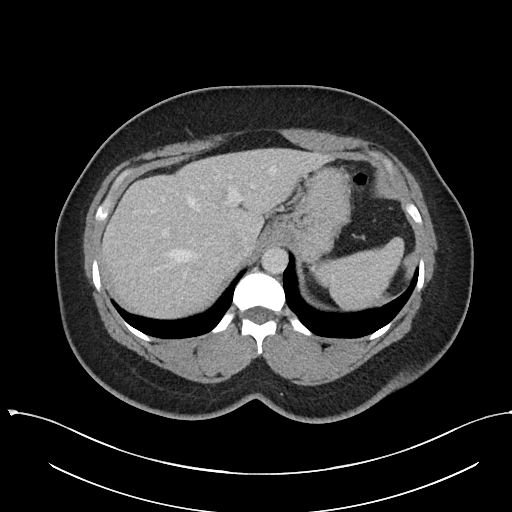
[im 75/87  soft-tissue]
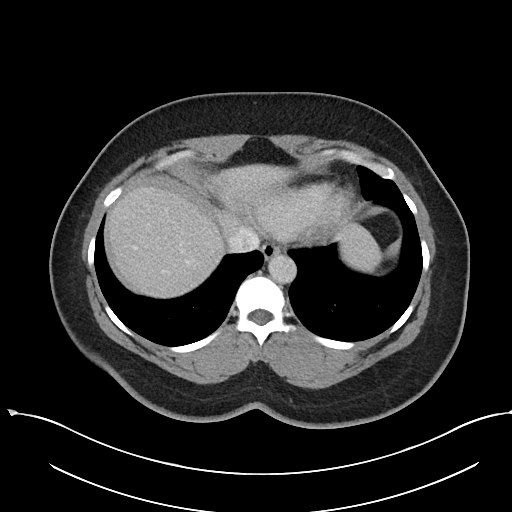
[im 81/87  soft-tissue]
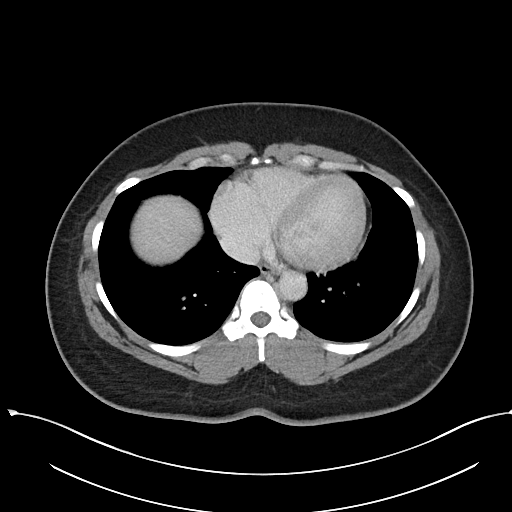

[Series 6: abdomen 3.0 mpr cor · coronal · 0.70mm/px · 3 of 89 slices shown]
[im 30/89  soft-tissue]
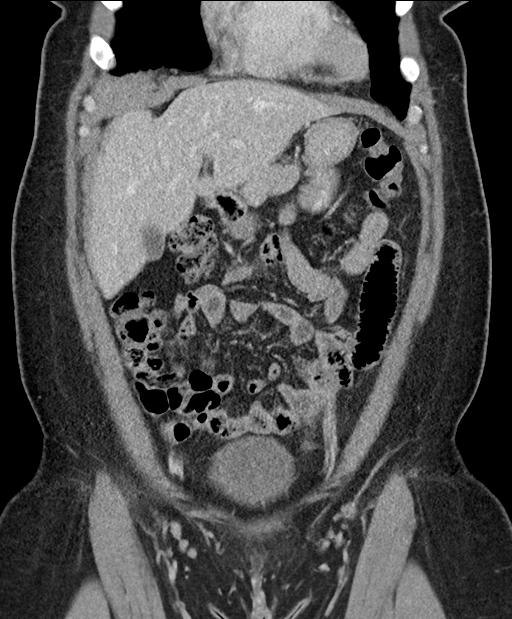
[im 40/89  soft-tissue]
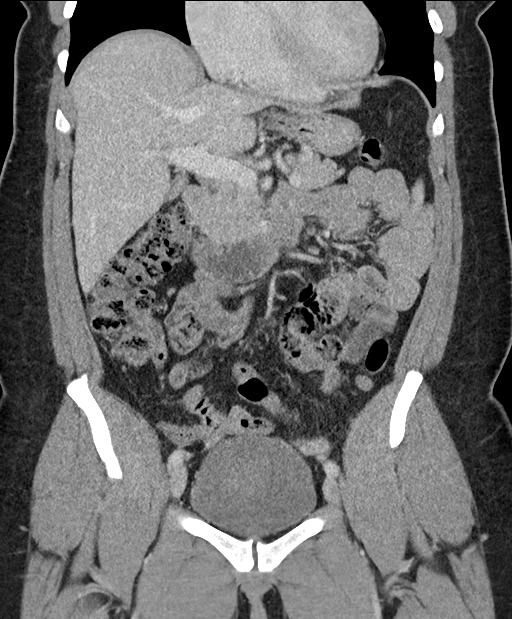
[im 49/89  soft-tissue]
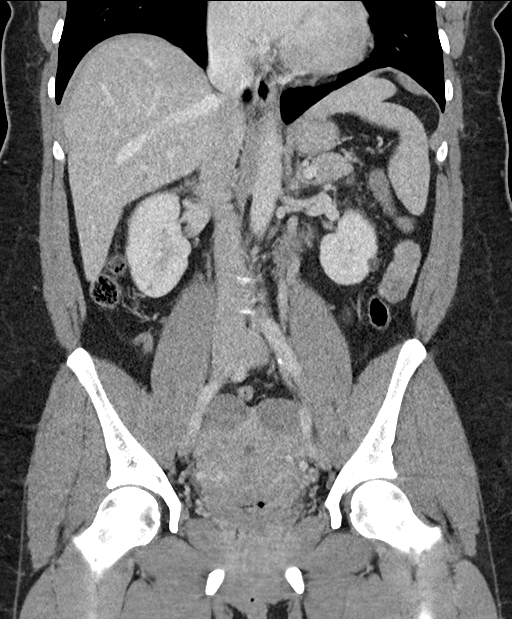

[16 of 46 positions shown; findings below may reference images not displayed]

FINDINGS: LOWER CHEST: Lung bases are clear. Included heart size is normal. No
pericardial effusion.

HEPATOBILIARY: 2 subcentimeter hypodensities in the dome of the
liver could reflect cysts. Liver and gallbladder are otherwise
unremarkable. Mild focal fatty infiltration about the falciform
ligament.

PANCREAS: Normal.

SPLEEN: Normal.

ADRENALS/URINARY TRACT: Kidneys are orthotopic, demonstrating
symmetric enhancement. No nephrolithiasis, hydronephrosis or solid
renal masses. Too small to characterize hypodensities bilateral
kidneys. The unopacified ureters are normal in course and caliber.
Urinary bladder is partially distended and unremarkable. Normal
adrenal glands.

STOMACH/BOWEL: The stomach, small and large bowel are normal in
course and caliber without inflammatory changes. Normal appendix.

VASCULAR/LYMPHATIC: Aortoiliac vessels are normal in course and
caliber. Mild calcific atherosclerosis. No lymphadenopathy by CT
size criteria.

REPRODUCTIVE: Normal.  16 mm RIGHT Bartholin cyst.

OTHER: Trace free fluid in the pelvis is likely physiologic. No
intraperitoneal free air or focal fluid collections.

MUSCULOSKELETAL: Nonacute.  Mild rectus abdominus diastasis.
IMPRESSION: 1. No acute intra-abdominal/pelvic process.  Normal appendix.
2.  Aortic Atherosclerosis ([id number]).
